# Patient Record
Sex: Male | Born: 1940 | Race: White | Hispanic: No | Marital: Married | Smoking: Former smoker
Health system: Southern US, Community
[De-identification: ages and names within clinical notes are randomized; demographics above are authoritative.]

## PROBLEM LIST (undated history)

## (undated) DIAGNOSIS — N400 Enlarged prostate without lower urinary tract symptoms: Secondary | ICD-10-CM

## (undated) DIAGNOSIS — M069 Rheumatoid arthritis, unspecified: Secondary | ICD-10-CM

## (undated) DIAGNOSIS — N419 Inflammatory disease of prostate, unspecified: Secondary | ICD-10-CM

## (undated) DIAGNOSIS — J309 Allergic rhinitis, unspecified: Secondary | ICD-10-CM

## (undated) DIAGNOSIS — D126 Benign neoplasm of colon, unspecified: Secondary | ICD-10-CM

## (undated) DIAGNOSIS — N529 Male erectile dysfunction, unspecified: Secondary | ICD-10-CM

## (undated) DIAGNOSIS — E785 Hyperlipidemia, unspecified: Secondary | ICD-10-CM

## (undated) HISTORY — DX: Benign neoplasm of colon, unspecified: D12.6

## (undated) HISTORY — DX: Benign prostatic hyperplasia without lower urinary tract symptoms: N40.0

## (undated) HISTORY — DX: Rheumatoid arthritis, unspecified: M06.9

## (undated) HISTORY — DX: Male erectile dysfunction, unspecified: N52.9

## (undated) HISTORY — PX: OTHER SURGICAL HISTORY: SHX169

## (undated) HISTORY — DX: Inflammatory disease of prostate, unspecified: N41.9

## (undated) HISTORY — DX: Hyperlipidemia, unspecified: E78.5

## (undated) HISTORY — DX: Allergic rhinitis, unspecified: J30.9

## (undated) HISTORY — PX: COLONOSCOPY WITH ESOPHAGOGASTRODUODENOSCOPY (EGD): SHX5779

---

## 2009-09-03 ENCOUNTER — Ambulatory Visit: Payer: Self-pay | Admitting: Unknown Physician Specialty

## 2012-06-13 ENCOUNTER — Ambulatory Visit: Payer: Self-pay | Admitting: Rheumatology

## 2012-06-13 IMAGING — CT CT ABD-PELV W/ CM
1 of 4 series · 15 of 32 positions shown, 19 images · IV contrast (isovue)
Comparison: none

REASON FOR EXAM: weight loss  abnormal lymph glands in right pelvis
COMMENTS:

PROCEDURE:     KCT - KCT ABDOMEN/PELVIS W  - [DATE] [DATE]
RESULT:     Comparison:  None
TECHNIQUE: Multiple axial images of the abdomen and pelvis were performed
from the lung bases to the pubic symphysis, with p.o. contrast and with 85
mL of Isovue 370 intravenous contrast.

[Series 2: abd 3mm w 3.0 i40f 3 · axial · 0.73mm/px · z∈[-1060,-649]mm · 15 of 151 slices shown, 19 images]
[im 7/151  soft-tissue]
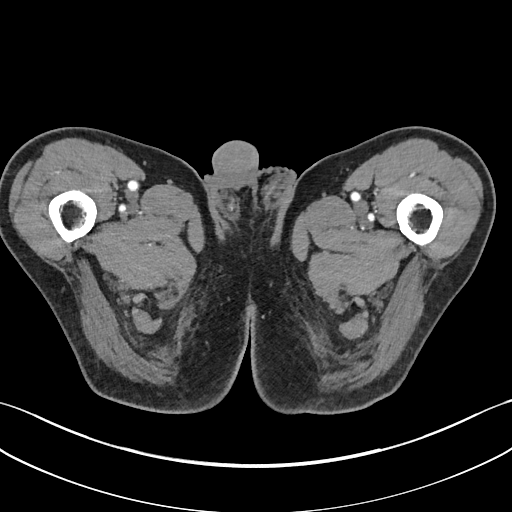
[im 7/151  bone]
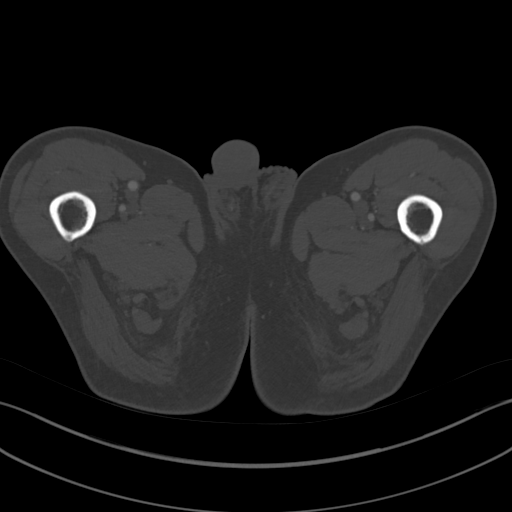
[im 19/151  soft-tissue]
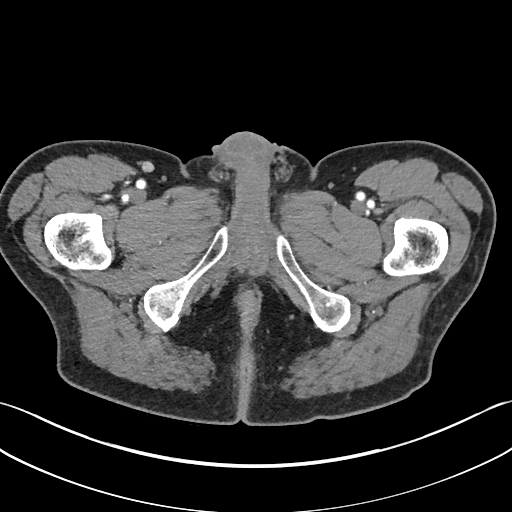
[im 32/151  soft-tissue]
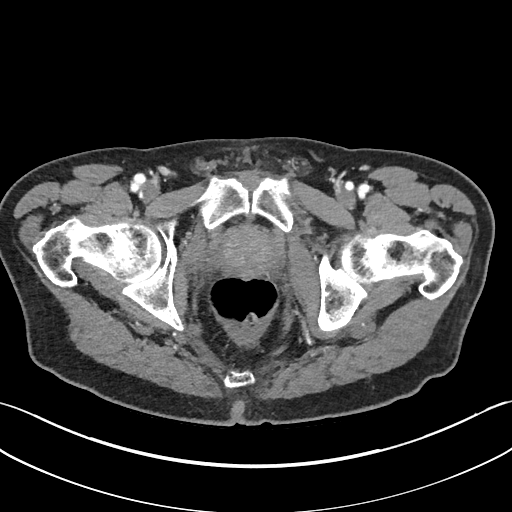
[im 44/151  soft-tissue]
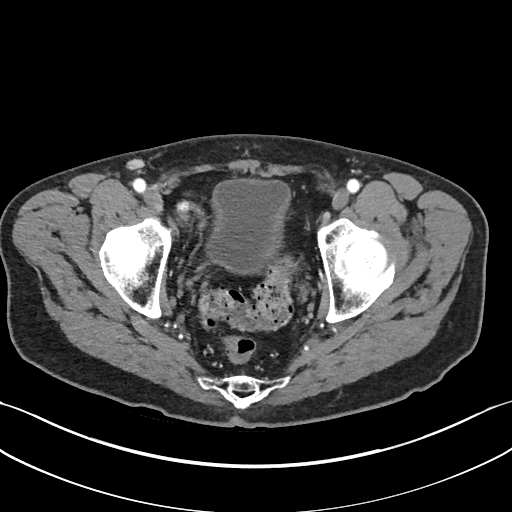
[im 51/151  soft-tissue]
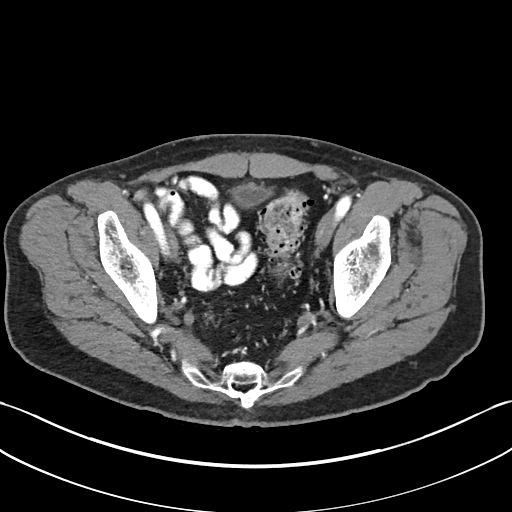
[im 63/151  soft-tissue]
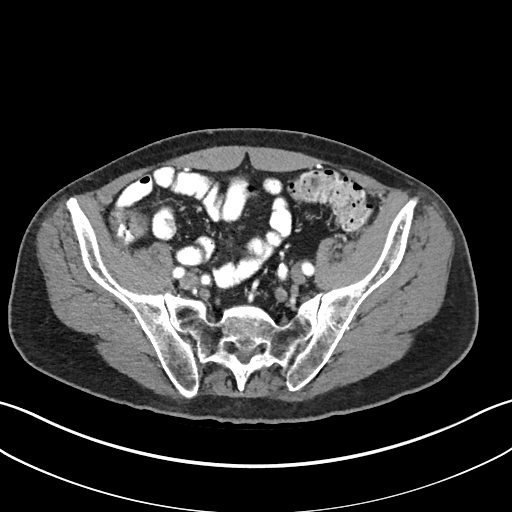
[im 76/151  soft-tissue]
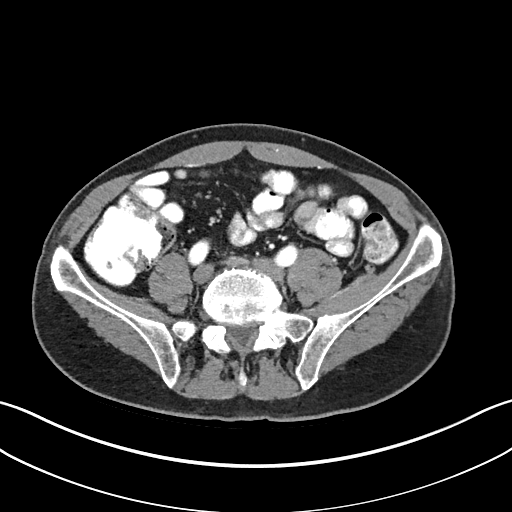
[im 88/151  soft-tissue]
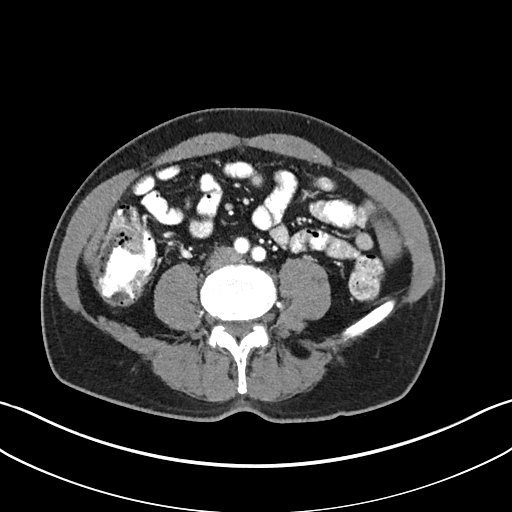
[im 101/151  soft-tissue]
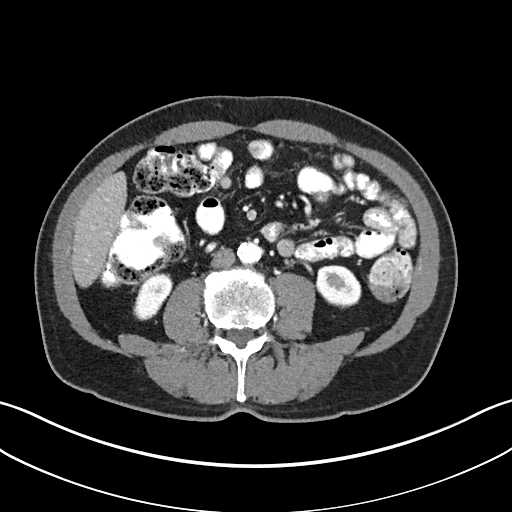
[im 101/151  bone]
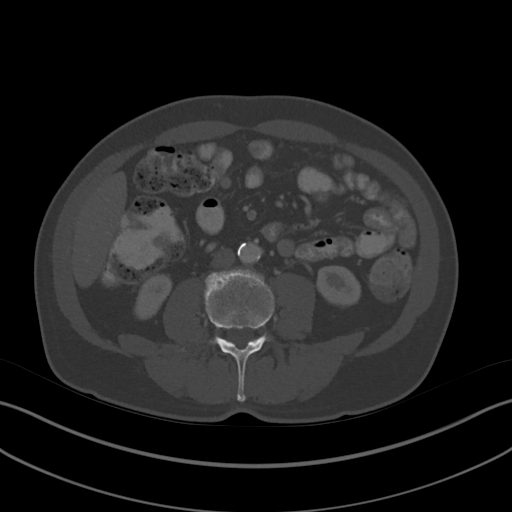
[im 107/151  soft-tissue]
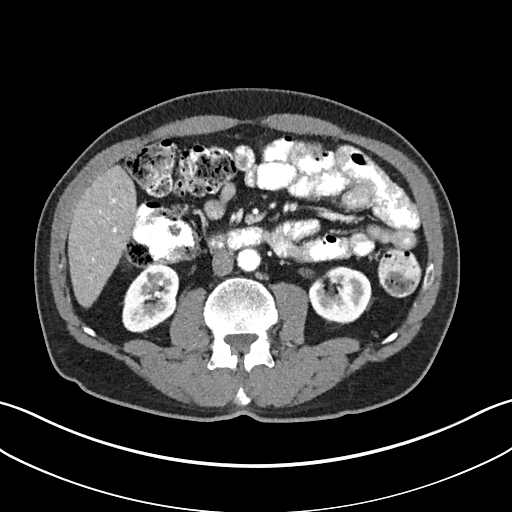
[im 119/151  soft-tissue]
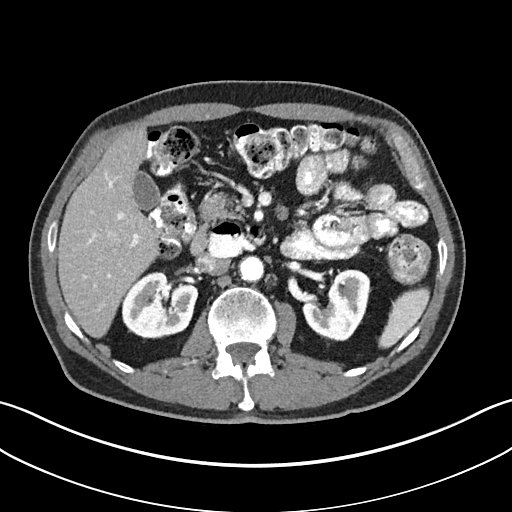
[im 126/151  lung]
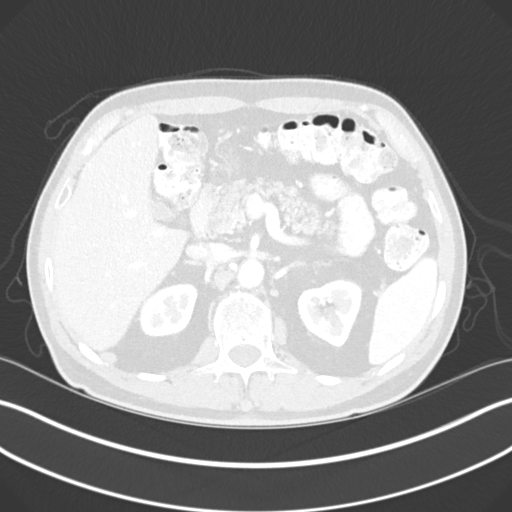
[im 132/151  soft-tissue]
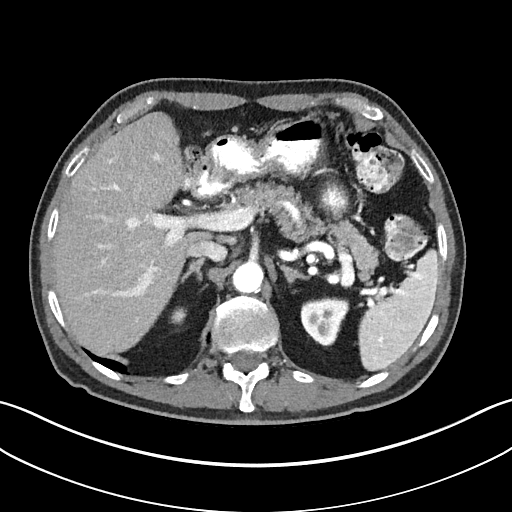
[im 132/151  lung]
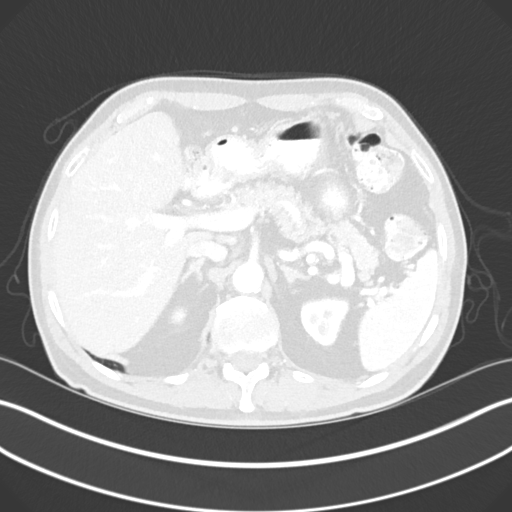
[im 138/151  lung]
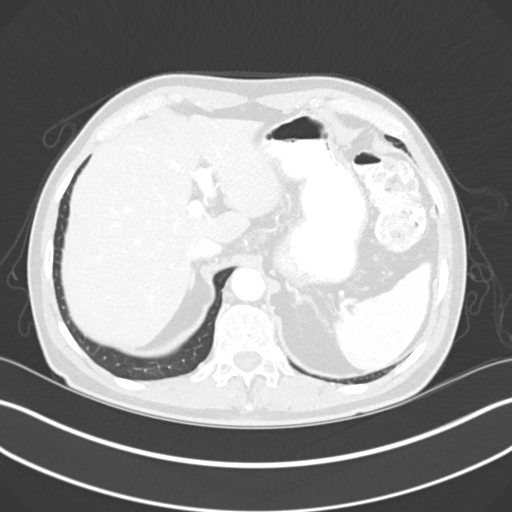
[im 144/151  soft-tissue]
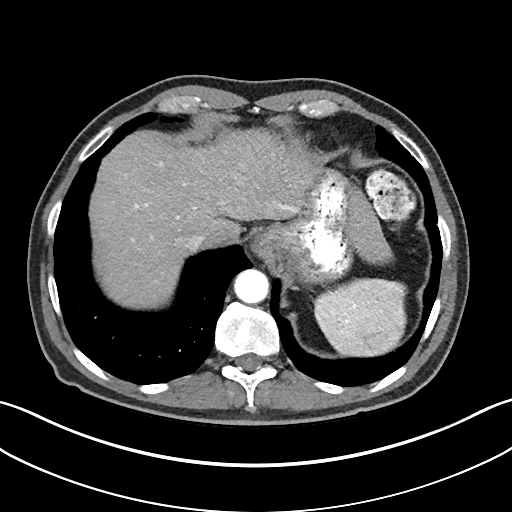
[im 144/151  lung]
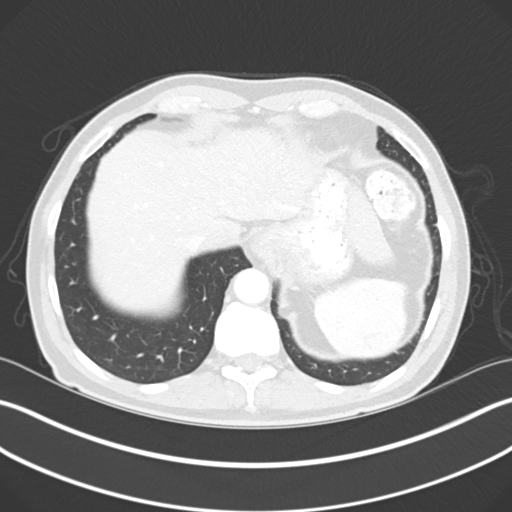

[15 of 32 positions shown; findings below may reference images not displayed]

FINDINGS: Mild wall thickening of the distal esophagus is likely secondary to
underdistention and a small hilar hernia.

The liver is slightly low in attenuation, raising the possibility of hepatic
steatosis. There may be a small stone within the gallbladder. The spleen,
adrenals, and pancreas are unremarkable. There is a duodenal diverticulum
extending from the junction of the second and third segment of the duodenum.
Mild soft tissue thickening along the posterior wall of the gastric fundus
and likely secondary to underdistention. The kidneys enhance normally.

No mesenteric or retroperitoneal lymphadenopathy. There is a focal area of
circumferential wall thickening of the inferior sigmoid colon near the
rectum as seen on image 117. This could be related to underdistention.
However, malignancy cannot excluded. There is diverticulosis of the sigmoid
colon. The small and large bowel are normal in caliber.

No aggressive lytic or sclerotic osseous lesions are identified.
IMPRESSION: 1. Focal apparent wall thickening of the inferior sigmoid colon near the
rectum could be related to underdistention, but the possibility of
malignancy is raised. Colonoscopy is recommended for further evaluation, if
not recently performed.
2. Mild apparent thickening of the distal esophagus is likely related to
underdistention and a small hiatal hernia. Mild thickening along the
posterior wall the fundus but the secondary to underdistention. However,
endoscopy could be performed for further evaluation, as indicated.

## 2012-06-22 ENCOUNTER — Ambulatory Visit: Payer: Self-pay | Admitting: Unknown Physician Specialty

## 2012-06-26 LAB — PATHOLOGY REPORT

## 2013-01-25 ENCOUNTER — Ambulatory Visit: Payer: Self-pay | Admitting: Unknown Physician Specialty

## 2014-07-14 DIAGNOSIS — Z23 Encounter for immunization: Secondary | ICD-10-CM | POA: Diagnosis not present

## 2014-12-23 DIAGNOSIS — Z23 Encounter for immunization: Secondary | ICD-10-CM | POA: Diagnosis not present

## 2015-01-20 DIAGNOSIS — M069 Rheumatoid arthritis, unspecified: Secondary | ICD-10-CM | POA: Diagnosis not present

## 2015-01-20 DIAGNOSIS — Z79899 Other long term (current) drug therapy: Secondary | ICD-10-CM | POA: Diagnosis not present

## 2015-02-12 DIAGNOSIS — M0579 Rheumatoid arthritis with rheumatoid factor of multiple sites without organ or systems involvement: Secondary | ICD-10-CM | POA: Diagnosis not present

## 2015-02-13 DIAGNOSIS — N400 Enlarged prostate without lower urinary tract symptoms: Secondary | ICD-10-CM | POA: Diagnosis not present

## 2015-02-13 DIAGNOSIS — N401 Enlarged prostate with lower urinary tract symptoms: Secondary | ICD-10-CM | POA: Diagnosis not present

## 2015-02-13 DIAGNOSIS — Z87438 Personal history of other diseases of male genital organs: Secondary | ICD-10-CM | POA: Diagnosis not present

## 2015-03-26 ENCOUNTER — Other Ambulatory Visit: Payer: Self-pay | Admitting: Urology

## 2015-03-26 DIAGNOSIS — N411 Chronic prostatitis: Secondary | ICD-10-CM

## 2015-03-28 ENCOUNTER — Other Ambulatory Visit: Payer: Self-pay | Admitting: Urology

## 2015-05-13 ENCOUNTER — Telehealth: Payer: Self-pay

## 2015-05-13 DIAGNOSIS — N390 Urinary tract infection, site not specified: Secondary | ICD-10-CM

## 2015-05-13 NOTE — Telephone Encounter (Signed)
Spoke with pt wife, Pamala Hurry, who stated pt initially stopped taking macrobid in April after last appt. Per wife pt then developed an infection and was on cipro 500mg  bid x4weeks. Starting 04/27/15 pt started taking macrobid bid as prophylaxis. Please advise.

## 2015-05-13 NOTE — Telephone Encounter (Signed)
Pt pharmacy sent a refill request for macrobid. Refill request stated pt reports taking 1 po bid. I dont see anything in epic and in [employer] I see where macrobid was d/c. Please advise.

## 2015-05-13 NOTE — Telephone Encounter (Signed)
I would call Mr. Swingler and ask him if he initiated the refill request.

## 2015-05-13 NOTE — Telephone Encounter (Signed)
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  Macrobid has been known to cause pulmonary issues in long-term use.  I would recommend a UCx and to start tamsulosin 0.4 mg po daily, 30 minutes after a meal.  We will start tamsulosin.    Please advise him of the side effects, such as: retrograde ejaculation, sinus congestion, nasal congestion, rhinorrhea, rhinitis, dizziness, and seasonal allergic rhinitis.  Pending UCx results, we can decide on follow up.

## 2015-05-14 ENCOUNTER — Other Ambulatory Visit: Payer: Self-pay

## 2015-05-14 ENCOUNTER — Other Ambulatory Visit: Payer: Medicare Other

## 2015-05-14 ENCOUNTER — Telehealth: Payer: Self-pay | Admitting: Urology

## 2015-05-14 DIAGNOSIS — M0579 Rheumatoid arthritis with rheumatoid factor of multiple sites without organ or systems involvement: Secondary | ICD-10-CM | POA: Diagnosis not present

## 2015-05-14 DIAGNOSIS — N410 Acute prostatitis: Secondary | ICD-10-CM | POA: Diagnosis not present

## 2015-05-14 MED ORDER — TAMSULOSIN HCL 0.4 MG PO CAPS: 0.4000 mg | ORAL_CAPSULE | Freq: Every day | ORAL | Status: DC

## 2015-05-14 NOTE — Telephone Encounter (Signed)
Pt called and left message requesting refill for Macrobid.  He tried to discontinue but was unsuccessful.  Please call pt [phone]

## 2015-05-14 NOTE — Telephone Encounter (Signed)
Spoke with pt and made aware of Shannon's orders. Pt voiced understanding. Pt will drop of urine for cx this afternoon. Tamsulosin was called into pharmacy.

## 2015-05-14 NOTE — Telephone Encounter (Signed)
LMOM

## 2015-05-16 LAB — URINE CULTURE: Organism ID, Bacteria: NO GROWTH

## 2015-05-18 ENCOUNTER — Telehealth: Payer: Self-pay | Admitting: Urology

## 2015-05-18 DIAGNOSIS — R339 Retention of urine, unspecified: Secondary | ICD-10-CM

## 2015-05-18 NOTE — Telephone Encounter (Signed)
-----   Message from Nori Riis, PA-C sent at 05/16/2015  6:22 PM EDT ----- UCx is negative.

## 2015-05-18 NOTE — Telephone Encounter (Signed)
Spoke w/pts wife and relayed the results of his u/a.  She mentioned a Rx that she thought the provider was going to send to their pharmacy.  I explained that the provider was not going to fill the Rx because it is no longer protocol (per Toniann Fail, LPN) and that Mr. Stuart Mcneil must come in for an appointment to address his ongoing symptoms. . . sm

## 2015-05-18 NOTE — Telephone Encounter (Signed)
Was patient's Flomax called into his pharmacy?  His wife called and stated the prescription was not called in.

## 2015-05-19 MED ORDER — TAMSULOSIN HCL 0.4 MG PO CAPS: 0.4000 mg | ORAL_CAPSULE | Freq: Every day | ORAL | Status: DC

## 2015-05-19 NOTE — Telephone Encounter (Signed)
Spoke with pt wife in reference to medication. Made wife aware medication was called in again. Wife voiced understanding.

## 2015-07-25 DIAGNOSIS — Z23 Encounter for immunization: Secondary | ICD-10-CM | POA: Diagnosis not present

## 2015-08-25 DIAGNOSIS — M0579 Rheumatoid arthritis with rheumatoid factor of multiple sites without organ or systems involvement: Secondary | ICD-10-CM | POA: Diagnosis not present

## 2015-09-30 DIAGNOSIS — H52212 Irregular astigmatism, left eye: Secondary | ICD-10-CM | POA: Diagnosis not present

## 2015-09-30 DIAGNOSIS — H00029 Hordeolum internum unspecified eye, unspecified eyelid: Secondary | ICD-10-CM | POA: Diagnosis not present

## 2015-09-30 DIAGNOSIS — H04123 Dry eye syndrome of bilateral lacrimal glands: Secondary | ICD-10-CM | POA: Diagnosis not present

## 2015-09-30 DIAGNOSIS — H524 Presbyopia: Secondary | ICD-10-CM | POA: Diagnosis not present

## 2015-11-06 DIAGNOSIS — M0579 Rheumatoid arthritis with rheumatoid factor of multiple sites without organ or systems involvement: Secondary | ICD-10-CM | POA: Diagnosis not present

## 2015-11-06 DIAGNOSIS — K2270 Barrett's esophagus without dysplasia: Secondary | ICD-10-CM | POA: Diagnosis not present

## 2015-11-06 DIAGNOSIS — Z86010 Personal history of colonic polyps: Secondary | ICD-10-CM | POA: Diagnosis not present

## 2015-11-06 DIAGNOSIS — N400 Enlarged prostate without lower urinary tract symptoms: Secondary | ICD-10-CM | POA: Diagnosis not present

## 2015-11-12 DIAGNOSIS — M0579 Rheumatoid arthritis with rheumatoid factor of multiple sites without organ or systems involvement: Secondary | ICD-10-CM | POA: Diagnosis not present

## 2016-01-25 ENCOUNTER — Encounter: Payer: Self-pay | Admitting: *Deleted

## 2016-02-04 DIAGNOSIS — M0579 Rheumatoid arthritis with rheumatoid factor of multiple sites without organ or systems involvement: Secondary | ICD-10-CM | POA: Diagnosis not present

## 2016-02-11 DIAGNOSIS — M0579 Rheumatoid arthritis with rheumatoid factor of multiple sites without organ or systems involvement: Secondary | ICD-10-CM | POA: Diagnosis not present

## 2016-02-12 ENCOUNTER — Ambulatory Visit (INDEPENDENT_AMBULATORY_CARE_PROVIDER_SITE_OTHER): Payer: Medicare Other | Admitting: Urology

## 2016-02-12 ENCOUNTER — Encounter: Payer: Self-pay | Admitting: Urology

## 2016-02-12 VITALS — BP 121/69 | HR 75 | Ht 71.75 in | Wt 162.5 lb

## 2016-02-12 DIAGNOSIS — N138 Other obstructive and reflux uropathy: Secondary | ICD-10-CM

## 2016-02-12 DIAGNOSIS — N401 Enlarged prostate with lower urinary tract symptoms: Secondary | ICD-10-CM | POA: Diagnosis not present

## 2016-02-12 DIAGNOSIS — N419 Inflammatory disease of prostate, unspecified: Secondary | ICD-10-CM | POA: Diagnosis not present

## 2016-02-12 MED ORDER — TAMSULOSIN HCL 0.4 MG PO CAPS: 0.4000 mg | ORAL_CAPSULE | Freq: Every day | ORAL | Status: DC

## 2016-02-12 NOTE — Progress Notes (Signed)
02/12/2016 9:56 AM   [place] 1941-07-04 [id number]  Referring provider: Leonel Ramsay, MD [place], [place] [address]  Chief Complaint  Patient presents with  . Prostatitis    1 year follow up    HPI: Patient is a 75 year old Caucasian male with a history of chronic prostatitis and BPH with LUTS who presents today for a 1 year follow-up.   Chronic prostatitis Patient has a hx of prostatitis with symptoms being suprapubic and perineal achiness. His LUTS are also increased during these episodes. He has not had any prostatitis symptoms over the last year. He had been on daily Macrobid for a number of years, but he discontinued that therapy approximately 2 years ago.  He is now on tamsulosin daily.    BPH WITH LUTS His IPSS score today is 7, which is mild lower urinary tract symptomatology. He is mostly satisfied with his quality life due to his urinary symptoms.  He denies any dysuria, hematuria or suprapubic pain.   He currently taking tamsulosin 0.4 mg daily.  He also denies any recent fevers, chills, nausea or vomiting.  He does not have a family history of PCa.      IPSS      02/12/16 0900       International Prostate Symptom Score   How often have you had the sensation of not emptying your bladder? Less than 1 in 5     How often have you had to urinate less than every two hours? Less than 1 in 5 times     How often have you found you stopped and started again several times when you urinated? Less than 1 in 5 times     How often have you found it difficult to postpone urination? Less than 1 in 5 times     How often have you had a weak urinary stream? Less than 1 in 5 times     How often have you had to strain to start urination? Not at All     How many times did you typically get up at night to urinate? 2 Times     Total IPSS Score 7     Quality of Life due to urinary symptoms   If you were to spend the rest of your life with your urinary  condition just the way it is now how would you feel about that? Mostly Satisfied        Score:  1-7 Mild 8-19 Moderate 20-35 Severe     PMH: Past Medical History  Diagnosis Date  . Adenomatous colon polyp   . ED (erectile dysfunction)   . BPH (benign prostatic hyperplasia)   . HLD (hyperlipidemia)   . RA (rheumatoid arthritis) ([place])   . AR (allergic rhinitis)   . Prostatitis     Surgical History: Past Surgical History  Procedure Laterality Date  . None      Home Medications:    Medication List       This list is accurate as of: 02/12/16  9:56 AM.  Always use your most recent med list.               aspirin EC 81 MG tablet  Take by mouth.     ENBREL SURECLICK 50 MG/ML injection  Generic drug:  etanercept     etodolac 400 MG tablet  Commonly known as:  LODINE  Reported on [id number]     folic acid 1 MG tablet  Commonly known as:  FOLVITE     Glucosamine-Chondroitin 500-400 MG Caps  Take by mouth.     methotrexate 2.5 MG tablet  Commonly known as:  RHEUMATREX     nitrofurantoin (macrocrystal-monohydrate) 100 MG capsule  Commonly known as:  MACROBID  Take 1 capsule (100 mg total) by mouth daily.     nitrofurantoin 100 MG capsule  Commonly known as:  MACRODANTIN  Take by mouth. Reported on 02/12/2016     PRILOSEC 20 MG capsule  Generic drug:  omeprazole  Take by mouth.     SAW PALMETTO PO  Take by mouth.     tamsulosin 0.4 MG Caps capsule  Commonly known as:  FLOMAX  Take 1 capsule (0.4 mg total) by mouth daily.     tamsulosin 0.4 MG Caps capsule  Commonly known as:  FLOMAX  Take 1 capsule (0.4 mg total) by mouth daily.        Allergies: No Known Allergies  Family History: Family History  Problem Relation Age of Onset  . Diabetes Mellitus II Father   . Diabetes Mellitus II Sister   . Kidney disease Neg Hx   . Prostate cancer Neg Hx     Social History:  reports that he has quit smoking. He does not have any smokeless tobacco  history on file. He reports that he does not drink alcohol or use illicit drugs.  ROS: UROLOGY Frequent Urination?: No Hard to postpone urination?: No Burning/pain with urination?: No Get up at night to urinate?: No Leakage of urine?: No Urine stream starts and stops?: No Trouble starting stream?: No Do you have to strain to urinate?: No Blood in urine?: No Urinary tract infection?: No Sexually transmitted disease?: No Injury to kidneys or bladder?: No Painful intercourse?: No Weak stream?: No Erection problems?: No Penile pain?: No  Gastrointestinal Nausea?: No Vomiting?: No Indigestion/heartburn?: No Diarrhea?: No Constipation?: No  Constitutional Fever: No Night sweats?: No Weight loss?: No Fatigue?: No  Skin Skin rash/lesions?: No Itching?: No  Eyes Blurred vision?: No Double vision?: No  Ears/Nose/Throat Sore throat?: No Sinus problems?: No  Hematologic/Lymphatic Swollen glands?: No Easy bruising?: No  Cardiovascular Leg swelling?: No Chest pain?: No  Respiratory Cough?: No Shortness of breath?: No  Endocrine Excessive thirst?: No  Musculoskeletal Back pain?: No Joint pain?: No  Neurological Headaches?: No Dizziness?: No  Psychologic Depression?: No Anxiety?: No  Physical Exam: BP 121/69 mmHg  Pulse 75  Ht 5' 11.75" (1.822 m)  Wt 162 lb 8 oz (73.71 kg)  BMI 22.20 kg/m2  Constitutional: Well nourished. Alert and oriented, No acute distress. HEENT: [place] AT, moist mucus membranes. Trachea midline, no masses. Cardiovascular: No clubbing, cyanosis, or edema. Respiratory: Normal respiratory effort, no increased work of breathing. GI: Abdomen is soft, non tender, non distended, no abdominal masses. Liver and spleen not palpable.  No hernias appreciated.  Stool sample for occult testing is not indicated.   GU: No CVA tenderness.  No bladder fullness or masses.  Patient with uncircumcised phallus. Foreskin easily retracted Urethral  meatus is patent.  No penile discharge. No penile lesions or rashes. Scrotum without lesions, cysts, rashes and/or edema.  Testicles are located scrotally bilaterally. No masses are appreciated in the testicles. Left and right epididymis are normal. Rectal: Patient with  normal sphincter tone. Anus and perineum without scarring or rashes. No rectal masses are appreciated. Prostate is approximately 60 grams, no nodules are appreciated. Seminal vesicles are normal. Skin: No rashes, bruises or suspicious lesions.  Lymph: No cervical or inguinal adenopathy. Neurologic: Grossly intact, no focal deficits, moving all 4 extremities. Psychiatric: Normal mood and affect.  Laboratory Data: PSA History:  0.9 ng/mL on 02/11/2013  1.1 ng/mL on 02/12/2014  1.1 ng/mL on 02/13/2015  1.0 ng/mL on 02/12/2016     Assessment & Plan:    1. Prostatitis:   Patient's prostatitis seems to be controlled with tamsulosin 0.4 mg daily. He'll continue this medication. He will follow-up in one year for symptom recheck and exam.  2. BPH with LUTS:   IPSS score is 7/2.  He will continue the tamsulosin 0.4 mg daily.  He will have his IPSS score, exam and PSA in 12 months.   - PSA   Return in about 1 year (around 02/11/2017) for IPSS and exam.  These notes generated with voice recognition software. I apologize for typographical errors.  Zara Council, [facility] [address], [place], [place] [address] [phone]

## 2016-02-13 LAB — PSA: PROSTATE SPECIFIC AG, SERUM: 1 ng/mL (ref 0.0–4.0)

## 2016-02-15 ENCOUNTER — Telehealth: Payer: Self-pay

## 2016-02-15 NOTE — Telephone Encounter (Signed)
Spoke with pt wife in reference to PSA results. Wife voiced understanding.  

## 2016-02-15 NOTE — Telephone Encounter (Signed)
-----   Message from Nori Riis, PA-C sent at 02/14/2016  5:47 PM EDT ----- PSA is stable we will see him in one year.

## 2016-02-18 DIAGNOSIS — N419 Inflammatory disease of prostate, unspecified: Secondary | ICD-10-CM | POA: Insufficient documentation

## 2016-02-18 DIAGNOSIS — N138 Other obstructive and reflux uropathy: Secondary | ICD-10-CM | POA: Insufficient documentation

## 2016-02-18 DIAGNOSIS — N401 Enlarged prostate with lower urinary tract symptoms: Principal | ICD-10-CM | POA: Insufficient documentation

## 2016-05-10 DIAGNOSIS — M0579 Rheumatoid arthritis with rheumatoid factor of multiple sites without organ or systems involvement: Secondary | ICD-10-CM | POA: Diagnosis not present

## 2016-07-19 DIAGNOSIS — Z23 Encounter for immunization: Secondary | ICD-10-CM | POA: Diagnosis not present

## 2016-08-09 DIAGNOSIS — M0579 Rheumatoid arthritis with rheumatoid factor of multiple sites without organ or systems involvement: Secondary | ICD-10-CM | POA: Diagnosis not present

## 2016-11-01 DIAGNOSIS — N400 Enlarged prostate without lower urinary tract symptoms: Secondary | ICD-10-CM | POA: Diagnosis not present

## 2016-11-01 DIAGNOSIS — K2270 Barrett's esophagus without dysplasia: Secondary | ICD-10-CM | POA: Diagnosis not present

## 2016-11-01 DIAGNOSIS — M0579 Rheumatoid arthritis with rheumatoid factor of multiple sites without organ or systems involvement: Secondary | ICD-10-CM | POA: Diagnosis not present

## 2016-11-07 DIAGNOSIS — K2270 Barrett's esophagus without dysplasia: Secondary | ICD-10-CM | POA: Diagnosis not present

## 2016-11-07 DIAGNOSIS — L989 Disorder of the skin and subcutaneous tissue, unspecified: Secondary | ICD-10-CM | POA: Diagnosis not present

## 2016-11-07 DIAGNOSIS — N400 Enlarged prostate without lower urinary tract symptoms: Secondary | ICD-10-CM | POA: Diagnosis not present

## 2016-11-07 DIAGNOSIS — Z8739 Personal history of other diseases of the musculoskeletal system and connective tissue: Secondary | ICD-10-CM | POA: Diagnosis not present

## 2016-11-07 DIAGNOSIS — Z0000 Encounter for general adult medical examination without abnormal findings: Secondary | ICD-10-CM | POA: Diagnosis not present

## 2016-12-13 DIAGNOSIS — L728 Other follicular cysts of the skin and subcutaneous tissue: Secondary | ICD-10-CM | POA: Diagnosis not present

## 2016-12-13 DIAGNOSIS — D2372 Other benign neoplasm of skin of left lower limb, including hip: Secondary | ICD-10-CM | POA: Diagnosis not present

## 2016-12-13 DIAGNOSIS — D485 Neoplasm of uncertain behavior of skin: Secondary | ICD-10-CM | POA: Diagnosis not present

## 2017-01-05 DIAGNOSIS — L720 Epidermal cyst: Secondary | ICD-10-CM | POA: Diagnosis not present

## 2017-01-05 DIAGNOSIS — L728 Other follicular cysts of the skin and subcutaneous tissue: Secondary | ICD-10-CM | POA: Diagnosis not present

## 2017-01-05 DIAGNOSIS — R208 Other disturbances of skin sensation: Secondary | ICD-10-CM | POA: Diagnosis not present

## 2017-01-05 DIAGNOSIS — L538 Other specified erythematous conditions: Secondary | ICD-10-CM | POA: Diagnosis not present

## 2017-01-25 DIAGNOSIS — M0579 Rheumatoid arthritis with rheumatoid factor of multiple sites without organ or systems involvement: Secondary | ICD-10-CM | POA: Diagnosis not present

## 2017-02-06 ENCOUNTER — Other Ambulatory Visit: Payer: Self-pay

## 2017-02-06 ENCOUNTER — Other Ambulatory Visit: Payer: Medicare Other

## 2017-02-06 DIAGNOSIS — N401 Enlarged prostate with lower urinary tract symptoms: Secondary | ICD-10-CM | POA: Diagnosis not present

## 2017-02-07 DIAGNOSIS — Z79899 Other long term (current) drug therapy: Secondary | ICD-10-CM | POA: Diagnosis not present

## 2017-02-07 DIAGNOSIS — M0579 Rheumatoid arthritis with rheumatoid factor of multiple sites without organ or systems involvement: Secondary | ICD-10-CM | POA: Diagnosis not present

## 2017-02-07 LAB — PSA: Prostate Specific Ag, Serum: 1.4 ng/mL (ref 0.0–4.0)

## 2017-02-10 NOTE — Progress Notes (Signed)
02/13/2017 9:06 AM   [place] 10-25-1940 [id number]  Referring provider: Leonel Ramsay, MD [place], [place] [address]  Chief Complaint  Patient presents with  . Benign Prostatic Hypertrophy    1 year follow up  . Prostatitis    HPI: Patient is a 76 year old Caucasian male with a history of chronic prostatitis and BPH with LUTS who presents today for a 1 year follow-up.   History of chronic prostatitis Patient has a hx of prostatitis with symptoms being suprapubic and perineal achiness. His LUTS are also increased during these episodes. He has not had any prostatitis symptoms over the last year. He had been on daily Macrobid for a number of years, but he discontinued that therapy approximately 2 years ago.  He is now on tamsulosin daily.    BPH WITH LUTS His IPSS score today is 10, which is moderate lower urinary tract symptomatology. He is mostly satisfied with his quality life due to his urinary symptoms.  His PVR is 31 mL.  His previous I PSS score was 7/2.  His major complaint today is nocturia x 3.  He denies any dysuria, hematuria or suprapubic pain.   He currently taking tamsulosin 0.4 mg daily.  He also denies any recent fevers, chills, nausea or vomiting.  He does not have a family history of PCa.      IPSS    Row Name 02/13/17 0800         International Prostate Symptom Score   How often have you had the sensation of not emptying your bladder? Less than 1 in 5     How often have you had to urinate less than every two hours? Less than half the time     How often have you found you stopped and started again several times when you urinated? Less than 1 in 5 times     How often have you found it difficult to postpone urination? Less than 1 in 5 times     How often have you had a weak urinary stream? Less than half the time     How often have you had to strain to start urination? Not at All     How many times did you typically get up at  night to urinate? 3 Times     Total IPSS Score 10       Quality of Life due to urinary symptoms   If you were to spend the rest of your life with your urinary condition just the way it is now how would you feel about that? Mostly Satisfied        Score:  1-7 Mild 8-19 Moderate 20-35 Severe     PMH: Past Medical History:  Diagnosis Date  . Adenomatous colon polyp   . AR (allergic rhinitis)   . BPH (benign prostatic hyperplasia)   . ED (erectile dysfunction)   . HLD (hyperlipidemia)   . Prostatitis   . RA (rheumatoid arthritis) ([place])     Surgical History: Past Surgical History:  Procedure Laterality Date  . none      Home Medications:  Allergies as of 02/13/2017   No Known Allergies     Medication List       Accurate as of 02/13/17  9:06 AM. Always use your most recent med list.          aspirin EC 81 MG tablet Take by mouth.   ENBREL SURECLICK 50 MG/ML injection Generic  drug:  etanercept   etodolac 400 MG tablet Commonly known as:  LODINE Reported on 02/12/2016   fluticasone 50 MCG/ACT nasal spray Commonly known as:  FLONASE USE 2 SPRAYS IN EACH NOSTRIL ONCE DAILY   folic acid 1 MG tablet Commonly known as:  FOLVITE   GLUCOSAMINE CHONDROITIN ADV PO Take by mouth.   Glucosamine-Chondroitin 500-400 MG Caps Take by mouth.   methotrexate 2.5 MG tablet Commonly known as:  RHEUMATREX   nitrofurantoin (macrocrystal-monohydrate) 100 MG capsule Commonly known as:  MACROBID Take 1 capsule (100 mg total) by mouth daily.   nitrofurantoin 100 MG capsule Commonly known as:  MACRODANTIN Take by mouth. Reported on 02/12/2016   PRILOSEC 20 MG capsule Generic drug:  omeprazole Take by mouth.   tamsulosin 0.4 MG Caps capsule Commonly known as:  FLOMAX Take 1 capsule (0.4 mg total) by mouth daily.       Allergies: No Known Allergies  Family History: Family History  Problem Relation Age of Onset  . Diabetes Mellitus II Father   . Diabetes  Mellitus II Sister   . Kidney disease Neg Hx   . Prostate cancer Neg Hx   . Bladder Cancer Neg Hx     Social History:  reports that he has quit smoking. He has never used smokeless tobacco. He reports that he does not drink alcohol or use drugs.  ROS: UROLOGY Frequent Urination?: No Hard to postpone urination?: No Burning/pain with urination?: No Get up at night to urinate?: Yes Leakage of urine?: No Urine stream starts and stops?: No Trouble starting stream?: No Do you have to strain to urinate?: No Blood in urine?: No Urinary tract infection?: No Sexually transmitted disease?: No Injury to kidneys or bladder?: No Painful intercourse?: No Weak stream?: No Erection problems?: No Penile pain?: No  Gastrointestinal Nausea?: No Vomiting?: No Indigestion/heartburn?: No Diarrhea?: No Constipation?: No  Constitutional Fever: No Night sweats?: No Weight loss?: No Fatigue?: No  Skin Skin rash/lesions?: No Itching?: No  Eyes Blurred vision?: No Double vision?: No  Ears/Nose/Throat Sore throat?: No Sinus problems?: No  Hematologic/Lymphatic Swollen glands?: No Easy bruising?: No  Cardiovascular Leg swelling?: No Chest pain?: No  Respiratory Cough?: No Shortness of breath?: No  Endocrine Excessive thirst?: No  Musculoskeletal Back pain?: No Joint pain?: No  Neurological Headaches?: No Dizziness?: No  Psychologic Depression?: No Anxiety?: No  Physical Exam: BP (!) 142/70   Pulse 76   Ht 6' (1.829 m)   Wt 165 lb 4.8 oz (75 kg)   BMI 22.42 kg/m   Constitutional: Well nourished. Alert and oriented, No acute distress. HEENT: [place] AT, moist mucus membranes. Trachea midline, no masses. Cardiovascular: No clubbing, cyanosis, or edema. Respiratory: Normal respiratory effort, no increased work of breathing. GI: Abdomen is soft, non tender, non distended, no abdominal masses. Liver and spleen not palpable.  No hernias appreciated.  Stool sample for  occult testing is not indicated.   GU: No CVA tenderness.  No bladder fullness or masses.  Patient with uncircumcised phallus. Foreskin easily retracted Urethral meatus is patent.  No penile discharge. No penile lesions or rashes. Scrotum without lesions, cysts, rashes and/or edema.  Testicles are located scrotally bilaterally. No masses are appreciated in the testicles. Left and right epididymis are normal. Rectal: Patient with  normal sphincter tone. Anus and perineum without scarring or rashes. No rectal masses are appreciated. Prostate is approximately 60 grams, no nodules are appreciated. Seminal vesicles are normal. Skin: No rashes, bruises or suspicious lesions.  Lymph: No cervical or inguinal adenopathy. Neurologic: Grossly intact, no focal deficits, moving all 4 extremities. Psychiatric: Normal mood and affect.  Laboratory Data: PSA History:  0.9 ng/mL on 02/11/2013  1.1 ng/mL on 02/12/2014  1.1 ng/mL on 02/13/2015  1.0 ng/mL on 02/12/2016  1.4 ng/mL on 02/06/2017   Assessment & Plan:    1. History of chronic prostatitis:   Patient's prostatitis seems to be controlled with tamsulosin 0.4 mg daily. He'll continue this medication. He will follow-up in one year for symptom recheck and exam.  2. BPH with LUTS  - IPSS score is 10/2, it is worsening  - Continue conservative management, avoiding bladder irritants and timed voiding's  - most bothersome symptoms is/are nocturia x 3   - Continue tamsulosin 0.4 mg daily:refills given  - RTC in 12 months for IPS'S and exam   3. PSA screening  - I discussed the AUA Guideline's (2013) for men aged 57+ years or any man with less than a 10 to 15 year life expectancy that screening is not recommended.  If the individual is in excellent health and after discussion it is decided to do a screening PSA, the threshold for biopsy should be raised to 10 ng/mL and if the PSA returns below 3 ng/mL, discontinue screening - patient agrees  4. Nocturia  -  I explained to the patient that nocturia is often multi-factorial and difficult to treat.  Sleeping disorders, heart conditions, peripheral vascular disease, diabetes, an enlarged prostate for men, an urethral stricture causing bladder outlet obstruction and/or certain medications can contribute to nocturia.  - I have suggested that the patient avoid caffeine after noon and alcohol in the evening.  He or she may also benefit from fluid restrictions after 6:00 in the evening and voiding just prior to bedtime.  - I have explained that research studies have showed that over 84% of patients with sleep apnea reported frequent nighttime urination.   With sleep apnea, oxygen decreases, carbon dioxide increases, the blood become more acidic, the heart rate drops and blood vessels in the lung constrict.  The body is then alerted that something is very wrong. The sleeper must wake enough to reopen the airway. By this time, the heart is racing and experiences a false signal of fluid overload. The heart excretes a hormone-like protein that tells the body to get rid of sodium and water, resulting in nocturia.  -  I also informed the patient that a recent study noted that decreasing sodium intake to 2.3 grams daily, if they don't have issues with hyponatremia, can also reduce the number of nightly voids  - The patient may benefit from a discussion with his or her primary care physician to see if he or she has risk factors for sleep apnea or other sleep disturbances and obtaining a sleep study.   Return in about 1 year (around 02/13/2018) for IPSS and PVR.  These notes generated with voice recognition software. I apologize for typographical errors.  Zara Council, [facility] [address], [place], [place] [address] [phone]

## 2017-02-13 ENCOUNTER — Ambulatory Visit (INDEPENDENT_AMBULATORY_CARE_PROVIDER_SITE_OTHER): Payer: Medicare Other | Admitting: Urology

## 2017-02-13 ENCOUNTER — Encounter: Payer: Self-pay | Admitting: Urology

## 2017-02-13 VITALS — BP 142/70 | HR 76 | Ht 72.0 in | Wt 165.3 lb

## 2017-02-13 DIAGNOSIS — R351 Nocturia: Secondary | ICD-10-CM | POA: Diagnosis not present

## 2017-02-13 DIAGNOSIS — R339 Retention of urine, unspecified: Secondary | ICD-10-CM

## 2017-02-13 DIAGNOSIS — N419 Inflammatory disease of prostate, unspecified: Secondary | ICD-10-CM

## 2017-02-13 DIAGNOSIS — N401 Enlarged prostate with lower urinary tract symptoms: Secondary | ICD-10-CM | POA: Diagnosis not present

## 2017-02-13 DIAGNOSIS — N138 Other obstructive and reflux uropathy: Secondary | ICD-10-CM

## 2017-02-13 LAB — BLADDER SCAN AMB NON-IMAGING: SCAN RESULT: 31

## 2017-02-13 MED ORDER — TAMSULOSIN HCL 0.4 MG PO CAPS: 0.4000 mg | ORAL_CAPSULE | Freq: Every day | ORAL | 11 refills | Status: DC

## 2017-07-11 DIAGNOSIS — Z23 Encounter for immunization: Secondary | ICD-10-CM | POA: Diagnosis not present

## 2017-08-04 DIAGNOSIS — Z86010 Personal history of colonic polyps: Secondary | ICD-10-CM | POA: Diagnosis not present

## 2017-08-04 DIAGNOSIS — K2270 Barrett's esophagus without dysplasia: Secondary | ICD-10-CM | POA: Diagnosis not present

## 2017-08-09 DIAGNOSIS — Z79899 Other long term (current) drug therapy: Secondary | ICD-10-CM | POA: Diagnosis not present

## 2017-08-09 DIAGNOSIS — M0579 Rheumatoid arthritis with rheumatoid factor of multiple sites without organ or systems involvement: Secondary | ICD-10-CM | POA: Diagnosis not present

## 2017-10-18 DIAGNOSIS — H16143 Punctate keratitis, bilateral: Secondary | ICD-10-CM | POA: Diagnosis not present

## 2017-10-18 DIAGNOSIS — H524 Presbyopia: Secondary | ICD-10-CM | POA: Diagnosis not present

## 2017-10-18 DIAGNOSIS — H04123 Dry eye syndrome of bilateral lacrimal glands: Secondary | ICD-10-CM | POA: Diagnosis not present

## 2017-10-18 DIAGNOSIS — H16223 Keratoconjunctivitis sicca, not specified as Sjogren's, bilateral: Secondary | ICD-10-CM | POA: Diagnosis not present

## 2017-12-05 DIAGNOSIS — E785 Hyperlipidemia, unspecified: Secondary | ICD-10-CM | POA: Diagnosis not present

## 2017-12-05 DIAGNOSIS — K2270 Barrett's esophagus without dysplasia: Secondary | ICD-10-CM | POA: Diagnosis not present

## 2017-12-05 DIAGNOSIS — N400 Enlarged prostate without lower urinary tract symptoms: Secondary | ICD-10-CM | POA: Diagnosis not present

## 2017-12-05 DIAGNOSIS — Z0000 Encounter for general adult medical examination without abnormal findings: Secondary | ICD-10-CM | POA: Diagnosis not present

## 2017-12-05 DIAGNOSIS — Z8739 Personal history of other diseases of the musculoskeletal system and connective tissue: Secondary | ICD-10-CM | POA: Diagnosis not present

## 2017-12-12 DIAGNOSIS — M0579 Rheumatoid arthritis with rheumatoid factor of multiple sites without organ or systems involvement: Secondary | ICD-10-CM | POA: Diagnosis not present

## 2017-12-12 DIAGNOSIS — N400 Enlarged prostate without lower urinary tract symptoms: Secondary | ICD-10-CM | POA: Diagnosis not present

## 2017-12-12 DIAGNOSIS — Z0000 Encounter for general adult medical examination without abnormal findings: Secondary | ICD-10-CM | POA: Diagnosis not present

## 2017-12-12 DIAGNOSIS — E785 Hyperlipidemia, unspecified: Secondary | ICD-10-CM | POA: Diagnosis not present

## 2017-12-12 DIAGNOSIS — K2270 Barrett's esophagus without dysplasia: Secondary | ICD-10-CM | POA: Diagnosis not present

## 2017-12-19 DIAGNOSIS — K640 First degree hemorrhoids: Secondary | ICD-10-CM | POA: Diagnosis not present

## 2017-12-19 DIAGNOSIS — K317 Polyp of stomach and duodenum: Secondary | ICD-10-CM | POA: Diagnosis not present

## 2017-12-19 DIAGNOSIS — D131 Benign neoplasm of stomach: Secondary | ICD-10-CM | POA: Diagnosis not present

## 2017-12-19 DIAGNOSIS — Z86010 Personal history of colonic polyps: Secondary | ICD-10-CM | POA: Diagnosis not present

## 2017-12-19 DIAGNOSIS — K5730 Diverticulosis of large intestine without perforation or abscess without bleeding: Secondary | ICD-10-CM | POA: Diagnosis not present

## 2017-12-19 DIAGNOSIS — D126 Benign neoplasm of colon, unspecified: Secondary | ICD-10-CM | POA: Diagnosis not present

## 2017-12-19 DIAGNOSIS — K2270 Barrett's esophagus without dysplasia: Secondary | ICD-10-CM | POA: Diagnosis not present

## 2017-12-19 DIAGNOSIS — K2950 Unspecified chronic gastritis without bleeding: Secondary | ICD-10-CM | POA: Diagnosis not present

## 2017-12-19 DIAGNOSIS — K2990 Gastroduodenitis, unspecified, without bleeding: Secondary | ICD-10-CM | POA: Diagnosis not present

## 2017-12-19 DIAGNOSIS — D125 Benign neoplasm of sigmoid colon: Secondary | ICD-10-CM | POA: Diagnosis not present

## 2017-12-19 DIAGNOSIS — K210 Gastro-esophageal reflux disease with esophagitis: Secondary | ICD-10-CM | POA: Diagnosis not present

## 2017-12-19 DIAGNOSIS — K648 Other hemorrhoids: Secondary | ICD-10-CM | POA: Diagnosis not present

## 2018-01-31 DIAGNOSIS — Z79899 Other long term (current) drug therapy: Secondary | ICD-10-CM | POA: Diagnosis not present

## 2018-01-31 DIAGNOSIS — M0579 Rheumatoid arthritis with rheumatoid factor of multiple sites without organ or systems involvement: Secondary | ICD-10-CM | POA: Diagnosis not present

## 2018-02-08 DIAGNOSIS — Z79899 Other long term (current) drug therapy: Secondary | ICD-10-CM | POA: Diagnosis not present

## 2018-02-08 DIAGNOSIS — M0579 Rheumatoid arthritis with rheumatoid factor of multiple sites without organ or systems involvement: Secondary | ICD-10-CM | POA: Diagnosis not present

## 2018-02-12 ENCOUNTER — Ambulatory Visit: Payer: Medicare Other | Admitting: Urology

## 2018-02-24 DIAGNOSIS — J302 Other seasonal allergic rhinitis: Secondary | ICD-10-CM | POA: Diagnosis not present

## 2018-02-24 DIAGNOSIS — H1033 Unspecified acute conjunctivitis, bilateral: Secondary | ICD-10-CM | POA: Diagnosis not present

## 2018-03-13 NOTE — Progress Notes (Signed)
03/14/2018 11:54 AM   [place] 03-06-1941 [id number]  Referring provider: Leonel Ramsay, MD [place], [place] [address]  Chief Complaint  Patient presents with  . Prostatitis    HPI: Patient is a 77 year old Caucasian male with a history of chronic prostatitis and BPH with LUTS who presents today for a 1 year follow-up.   History of chronic prostatitis Patient has a hx of prostatitis with symptoms being suprapubic and perineal achiness. His LUTS are also increased during these episodes. He has not had any prostatitis symptoms over the last year. He had been on daily Macrobid for a number of years, but he discontinued that therapy approximately 3 years ago.  He is now on tamsulosin daily.    BPH WITH LUTS His IPSS score today is 8, which is moderate lower urinary tract symptomatology. He is mostly satisfied with his quality life due to his urinary symptoms.  His previous I PSS score was 10/2.  His previous PVR was 31 mL.  His major complaint today is frequency.  He denies any dysuria, hematuria or suprapubic pain.   He currently taking tamsulosin 0.4 mg daily.  He also denies any recent fevers, chills, nausea or vomiting.  He does not have a family history of PCa.  He would like to try a different medication other than tamsulosin due to retrograde ejaculation.    IPSS    Row Name 03/14/18 1100         International Prostate Symptom Score   How often have you had the sensation of not emptying your bladder?  Less than 1 in 5     How often have you had to urinate less than every two hours?  Less than 1 in 5 times     How often have you found you stopped and started again several times when you urinated?  Less than 1 in 5 times     How often have you found it difficult to postpone urination?  Less than 1 in 5 times     How often have you had a weak urinary stream?  Less than 1 in 5 times     How often have you had to strain to start urination?  Not  at All     How many times did you typically get up at night to urinate?  3 Times     Total IPSS Score  8       Quality of Life due to urinary symptoms   If you were to spend the rest of your life with your urinary condition just the way it is now how would you feel about that?  Mostly Satisfied        Score:  1-7 Mild 8-19 Moderate 20-35 Severe     PMH: Past Medical History:  Diagnosis Date  . Adenomatous colon polyp   . AR (allergic rhinitis)   . BPH (benign prostatic hyperplasia)   . ED (erectile dysfunction)   . HLD (hyperlipidemia)   . Prostatitis   . RA (rheumatoid arthritis) ([place])     Surgical History: Past Surgical History:  Procedure Laterality Date  . none      Home Medications:  Allergies as of 03/14/2018   No Known Allergies     Medication List        Accurate as of 03/14/18 11:54 AM. Always use your most recent med list.          aspirin EC 81 MG  tablet Take by mouth.   ENBREL SURECLICK 50 MG/ML injection Generic drug:  etanercept   etodolac 400 MG tablet Commonly known as:  LODINE Reported on 02/12/2016   fluticasone 50 MCG/ACT nasal spray Commonly known as:  FLONASE USE 2 SPRAYS IN EACH NOSTRIL ONCE DAILY   folic acid 1 MG tablet Commonly known as:  FOLVITE   GLUCOSAMINE CHONDROITIN ADV PO Take by mouth.   Glucosamine-Chondroitin 500-400 MG Caps Take by mouth.   methotrexate 2.5 MG tablet Commonly known as:  RHEUMATREX   MULTIVITAMIN ADULT PO Take by mouth daily.   nitrofurantoin 100 MG capsule Commonly known as:  MACRODANTIN Take by mouth. Reported on 02/12/2016   PRILOSEC 20 MG capsule Generic drug:  omeprazole Take by mouth.   tadalafil 5 MG tablet Commonly known as:  CIALIS Take 1 tablet (5 mg total) by mouth daily as needed for erectile dysfunction.   tamsulosin 0.4 MG Caps capsule Commonly known as:  FLOMAX Take 1 capsule (0.4 mg total) by mouth daily.       Allergies: No Known Allergies  Family  History: Family History  Problem Relation Age of Onset  . Diabetes Mellitus II Father   . Diabetes Mellitus II Sister   . Kidney disease Neg Hx   . Prostate cancer Neg Hx   . Bladder Cancer Neg Hx     Social History:  reports that he has quit smoking. He has never used smokeless tobacco. He reports that he does not drink alcohol or use drugs.  ROS: UROLOGY Frequent Urination?: Yes Hard to postpone urination?: No Burning/pain with urination?: No Get up at night to urinate?: No Leakage of urine?: No Urine stream starts and stops?: No Trouble starting stream?: No Do you have to strain to urinate?: No Blood in urine?: No Urinary tract infection?: No Sexually transmitted disease?: No Injury to kidneys or bladder?: No Painful intercourse?: No Weak stream?: No Erection problems?: Yes Penile pain?: No  Gastrointestinal Nausea?: No Vomiting?: No Indigestion/heartburn?: No Diarrhea?: No Constipation?: No  Constitutional Fever: No Night sweats?: No Weight loss?: No Fatigue?: No  Skin Skin rash/lesions?: No Itching?: No  Eyes Blurred vision?: No Double vision?: No  Ears/Nose/Throat Sore throat?: No Sinus problems?: Yes  Hematologic/Lymphatic Swollen glands?: No Easy bruising?: No  Cardiovascular Leg swelling?: No Chest pain?: No  Respiratory Cough?: No Shortness of breath?: No  Endocrine Excessive thirst?: No  Musculoskeletal Back pain?: No Joint pain?: No  Neurological Headaches?: No Dizziness?: No  Psychologic Depression?: No Anxiety?: No  Physical Exam: BP (!) 144/74   Pulse 68   Ht 6' (1.829 m)   Wt 154 lb 1.6 oz (69.9 kg)   BMI 20.90 kg/m   Constitutional: Well nourished. Alert and oriented, No acute distress. HEENT:  AT, moist mucus membranes. Trachea midline, no masses. Cardiovascular: No clubbing, cyanosis, or edema. Respiratory: Normal respiratory effort, no increased work of breathing. GI: Abdomen is soft, non tender, non  distended, no abdominal masses. Liver and spleen not palpable.  No hernias appreciated.  Stool sample for occult testing is not indicated.   GU: No CVA tenderness.  No bladder fullness or masses.  Patient with uncircumcised phallus.  Foreskin easily retracted   Urethral meatus is patent.  No penile discharge. No penile lesions or rashes. Scrotum without lesions, cysts, rashes and/or edema.  Testicles are located scrotally bilaterally. No masses are appreciated in the testicles. Left and right epididymis are normal. Rectal: Patient with  normal sphincter tone. Anus and perineum without  scarring or rashes. No rectal masses are appreciated. Prostate is approximately 60 grams, no nodules are appreciated. Seminal vesicles are normal. Skin: No rashes, bruises or suspicious lesions. Lymph: No cervical or inguinal adenopathy. Neurologic: Grossly intact, no focal deficits, moving all 4 extremities. Psychiatric: Normal mood and affect.   Laboratory Data: PSA History:  0.9 ng/mL on 02/11/2013  1.1 ng/mL on 02/12/2014  1.1 ng/mL on 02/13/2015  1.0 ng/mL on 02/12/2016  1.4 ng/mL on 02/06/2017 I have reviewed the labs.  Assessment & Plan:    1. History of chronic prostatitis:   Patient's prostatitis seems to be controlled with tamsulosin 0.4 mg daily. He'll continue this medication. He will follow-up in one year for symptom recheck and exam.  2. BPH with LUTS  - IPSS score is 8/2, it is improving  - Continue conservative management, avoiding bladder irritants and timed voiding's  - most bothersome symptoms is/are frequency which is at goal  - would like to try Cialis 5 mg daily instead of the tamsulosin, script is sent to pharmacy and will call back if it ineffective or cost prohibitive - we than switch to uroxatrol   - RTC in 12 months for IPS'S and exam   3. Nocturia  - not bothersome at this time  Return in about 1 year (around 03/15/2019) for IPSS, SHIM and exam.  These notes generated with  voice recognition software. I apologize for typographical errors.  Zara Council, PA-C  [facility] [address] [place], [place] [address] [phone]

## 2018-03-14 ENCOUNTER — Other Ambulatory Visit: Payer: Self-pay | Admitting: Urology

## 2018-03-14 ENCOUNTER — Encounter: Payer: Self-pay | Admitting: Urology

## 2018-03-14 ENCOUNTER — Ambulatory Visit (INDEPENDENT_AMBULATORY_CARE_PROVIDER_SITE_OTHER): Payer: Medicare Other | Admitting: Urology

## 2018-03-14 VITALS — BP 144/74 | HR 68 | Ht 72.0 in | Wt 154.1 lb

## 2018-03-14 DIAGNOSIS — N419 Inflammatory disease of prostate, unspecified: Secondary | ICD-10-CM | POA: Diagnosis not present

## 2018-03-14 DIAGNOSIS — R351 Nocturia: Secondary | ICD-10-CM | POA: Diagnosis not present

## 2018-03-14 DIAGNOSIS — N138 Other obstructive and reflux uropathy: Secondary | ICD-10-CM

## 2018-03-14 DIAGNOSIS — N401 Enlarged prostate with lower urinary tract symptoms: Secondary | ICD-10-CM

## 2018-03-14 DIAGNOSIS — R339 Retention of urine, unspecified: Secondary | ICD-10-CM

## 2018-03-14 MED ORDER — TADALAFIL 5 MG PO TABS: 5.0000 mg | ORAL_TABLET | Freq: Every day | ORAL | 3 refills | Status: DC | PRN

## 2018-03-16 ENCOUNTER — Other Ambulatory Visit: Payer: Self-pay | Admitting: Urology

## 2018-03-16 DIAGNOSIS — R339 Retention of urine, unspecified: Secondary | ICD-10-CM

## 2018-03-27 ENCOUNTER — Telehealth: Payer: Self-pay | Admitting: Urology

## 2018-03-27 NOTE — Telephone Encounter (Signed)
Pt called office asking to speak to [facility], states he has a few questions following up on his last office appt. About medication. Please advise. [phone].

## 2018-03-28 NOTE — Telephone Encounter (Signed)
Patient had a question regarding how long to wait for Cialis 5mg  daily  to work after stopping the Tamsulosin. Patient was told that these medications do work differently and the Cialis may need 2-4wks to see full effect. If by then he is not pleased with results per Shannon's note patient may restart Tamsulosin or try Uroxatrol

## 2018-05-04 DIAGNOSIS — H16143 Punctate keratitis, bilateral: Secondary | ICD-10-CM | POA: Diagnosis not present

## 2018-05-04 DIAGNOSIS — H10013 Acute follicular conjunctivitis, bilateral: Secondary | ICD-10-CM | POA: Diagnosis not present

## 2018-05-18 DIAGNOSIS — H16223 Keratoconjunctivitis sicca, not specified as Sjogren's, bilateral: Secondary | ICD-10-CM | POA: Diagnosis not present

## 2018-05-18 DIAGNOSIS — H16143 Punctate keratitis, bilateral: Secondary | ICD-10-CM | POA: Diagnosis not present

## 2018-05-21 ENCOUNTER — Ambulatory Visit (INDEPENDENT_AMBULATORY_CARE_PROVIDER_SITE_OTHER): Payer: Medicare Other | Admitting: Podiatry

## 2018-05-21 ENCOUNTER — Ambulatory Visit (INDEPENDENT_AMBULATORY_CARE_PROVIDER_SITE_OTHER): Payer: Medicare Other

## 2018-05-21 ENCOUNTER — Encounter: Payer: Self-pay | Admitting: Podiatry

## 2018-05-21 VITALS — BP 136/75 | HR 70 | Resp 16

## 2018-05-21 DIAGNOSIS — M722 Plantar fascial fibromatosis: Secondary | ICD-10-CM

## 2018-05-21 DIAGNOSIS — Z8739 Personal history of other diseases of the musculoskeletal system and connective tissue: Secondary | ICD-10-CM | POA: Insufficient documentation

## 2018-05-21 DIAGNOSIS — K2270 Barrett's esophagus without dysplasia: Secondary | ICD-10-CM | POA: Insufficient documentation

## 2018-05-21 DIAGNOSIS — Z86010 Personal history of colonic polyps: Secondary | ICD-10-CM | POA: Insufficient documentation

## 2018-05-21 NOTE — Progress Notes (Signed)
Subjective:  Patient ID: Stuart Mcneil, male    DOB: 03/22/1941,  MRN: [id number] HPI Chief Complaint  Patient presents with  . Foot Pain    Plantar heel right - aching x 5-6 weeks, AM pain, tried rolling massage, feeling some better than it was  . New Patient (Initial Visit)    77 y.o. male presents with the above complaint.   ROS: Denies fever chills nausea vomiting muscle aches pains calf pain back pain chest pain shortness of breath.  Past Medical History:  Diagnosis Date  . Adenomatous colon polyp   . AR (allergic rhinitis)   . BPH (benign prostatic hyperplasia)   . ED (erectile dysfunction)   . HLD (hyperlipidemia)   . Prostatitis   . RA (rheumatoid arthritis) ([place])    Past Surgical History:  Procedure Laterality Date  . none      Current Outpatient Medications:  .  aspirin EC 81 MG tablet, Take by mouth., Disp: , Rfl:  .  etanercept (ENBREL SURECLICK) 50 MG/ML injection, , Disp: , Rfl:  .  etodolac (LODINE) 400 MG tablet, Reported on 02/12/2016, Disp: , Rfl:  .  folic acid (FOLVITE) 1 MG tablet, , Disp: , Rfl:  .  Glucosamine-Chondroitin 500-400 MG CAPS, Take by mouth., Disp: , Rfl:  .  methotrexate (RHEUMATREX) 2.5 MG tablet, , Disp: , Rfl:  .  Multiple Vitamins-Minerals (MULTIVITAMIN ADULT PO), Take by mouth daily., Disp: , Rfl:  .  omeprazole (PRILOSEC) 20 MG capsule, Take by mouth., Disp: , Rfl:  .  tadalafil (CIALIS) 5 MG tablet, Take 1 tablet (5 mg total) by mouth daily as needed for erectile dysfunction., Disp: 90 tablet, Rfl: 3 .  tamsulosin (FLOMAX) 0.4 MG CAPS capsule, TAKE 1 CAPSULE BY MOUTH ONCE DAILY, Disp: 30 capsule, Rfl: 11  No Known Allergies Review of Systems Objective:   Vitals:   05/21/18 1130  BP: 136/75  Pulse: 70  Resp: 16    General: Well developed, nourished, in no acute distress, alert and oriented x3   Dermatological: Skin is warm, dry and supple bilateral. Nails x 10 are well maintained; remaining integument appears  unremarkable at this time. There are no open sores, no preulcerative lesions, no rash or signs of infection present.  Vascular: Dorsalis Pedis artery and Posterior Tibial artery pedal pulses are 2/4 bilateral with immedate capillary fill time. Pedal hair growth present. No varicosities and no lower extremity edema present bilateral.   Neruologic: Grossly intact via light touch bilateral. Vibratory intact via tuning fork bilateral. Protective threshold with Semmes Wienstein monofilament intact to all pedal sites bilateral. Patellar and Achilles deep tendon reflexes 2+ bilateral. No Babinski or clonus noted bilateral.   Musculoskeletal: No gross boney pedal deformities bilateral. No pain, crepitus, or limitation noted with foot and ankle range of motion bilateral. Muscular strength 5/5 in all groups tested bilateral.  He has pain on palpation medial calcaneal tubercle and central plantar calcaneal tubercle of the right heel.  Gait: Unassisted, Nonantalgic.    Radiographs:  Radiographs taken today do not demonstrate any type of acute trauma no signs of rheumatoid though he does demonstrate soft tissue increase in density of plantar fascial calcaneal site.  Assessment & Plan:   Assessment: Plantar fasciitis right heel.  Plan: After 20 mg of Kenalog 5 mg Marcaine I injected the right heel after sterile Betadine skin prep.  Tolerated procedure well without complications.  Placed in a plantar fascial brace and a night splint.  Discussed appropriate shoe gear  stretching exercise and ice therapy.     Djuan Talton T. [place], [place]

## 2018-05-21 NOTE — Patient Instructions (Addendum)
For instructions on how to put on your Plantar Fascial Brace, please visit [email or web address] For instructions on how to put on your Night Splint, please visit [email or web address]   Plantar Fasciitis (Heel Spur Syndrome) with Rehab The plantar fascia is a fibrous, ligament-like, soft-tissue structure that spans the bottom of the foot. Plantar fasciitis is a condition that causes pain in the foot due to inflammation of the tissue. SYMPTOMS   Pain and tenderness on the underneath side of the foot.  Pain that worsens with standing or walking. CAUSES  Plantar fasciitis is caused by irritation and injury to the plantar fascia on the underneath side of the foot. Common mechanisms of injury include:  Direct trauma to bottom of the foot.  Damage to a small nerve that runs under the foot where the main fascia attaches to the heel bone.  Stress placed on the plantar fascia due to bone spurs. RISK INCREASES WITH:   Activities that place stress on the plantar fascia (running, jumping, pivoting, or cutting).  Poor strength and flexibility.  Improperly fitted shoes.  Tight calf muscles.  Flat feet.  Failure to warm-up properly before activity.  Obesity. PREVENTION  Warm up and stretch properly before activity.  Allow for adequate recovery between workouts.  Maintain physical fitness:  Strength, flexibility, and endurance.  Cardiovascular fitness.  Maintain a health body weight.  Avoid stress on the plantar fascia.  Wear properly fitted shoes, including arch supports for individuals who have flat feet.  PROGNOSIS  If treated properly, then the symptoms of plantar fasciitis usually resolve without surgery. However, occasionally surgery is necessary.  RELATED COMPLICATIONS   Recurrent symptoms that may result in a chronic condition.  Problems of the lower back that are caused by compensating for the injury, such as limping.  Pain or weakness of the foot during  push-off following surgery.  Chronic inflammation, scarring, and partial or complete fascia tear, occurring more often from repeated injections.  TREATMENT  Treatment initially involves the use of ice and medication to help reduce pain and inflammation. The use of strengthening and stretching exercises may help reduce pain with activity, especially stretches of the Achilles tendon. These exercises may be performed at home or with a therapist. Your caregiver may recommend that you use heel cups of arch supports to help reduce stress on the plantar fascia. Occasionally, corticosteroid injections are given to reduce inflammation. If symptoms persist for greater than 6 months despite non-surgical (conservative), then surgery may be recommended.   MEDICATION   If pain medication is necessary, then nonsteroidal anti-inflammatory medications, such as aspirin and ibuprofen, or other minor pain relievers, such as acetaminophen, are often recommended.  Do not take pain medication within 7 days before surgery.  Prescription pain relievers may be given if deemed necessary by your caregiver. Use only as directed and only as much as you need.  Corticosteroid injections may be given by your caregiver. These injections should be reserved for the most serious cases, because they may only be given a certain number of times.  HEAT AND COLD  Cold treatment (icing) relieves pain and reduces inflammation. Cold treatment should be applied for 10 to 15 minutes every 2 to 3 hours for inflammation and pain and immediately after any activity that aggravates your symptoms. Use ice packs or massage the area with a piece of ice (ice massage).  Heat treatment may be used prior to performing the stretching and strengthening activities prescribed by your caregiver, physical therapist, or athletic trainer. Use a  heat pack or soak the injury in warm water.  SEEK IMMEDIATE MEDICAL CARE IF:  Treatment seems to offer no benefit,  or the condition worsens.  Any medications produce adverse side effects.  EXERCISES- RANGE OF MOTION (ROM) AND STRETCHING EXERCISES - Plantar Fasciitis (Heel Spur Syndrome) These exercises may help you when beginning to rehabilitate your injury. Your symptoms may resolve with or without further involvement from your physician, physical therapist or athletic trainer. While completing these exercises, remember:   Restoring tissue flexibility helps normal motion to return to the joints. This allows healthier, less painful movement and activity.  An effective stretch should be held for at least 30 seconds.  A stretch should never be painful. You should only feel a gentle lengthening or release in the stretched tissue.  RANGE OF MOTION - Toe Extension, Flexion  Sit with your right / left leg crossed over your opposite knee.  Grasp your toes and gently pull them back toward the top of your foot. You should feel a stretch on the bottom of your toes and/or foot.  Hold this stretch for 10 seconds.  Now, gently pull your toes toward the bottom of your foot. You should feel a stretch on the top of your toes and or foot.  Hold this stretch for 10 seconds. Repeat  times. Complete this stretch 3 times per day.   RANGE OF MOTION - Ankle Dorsiflexion, Active Assisted  Remove shoes and sit on a chair that is preferably not on a carpeted surface.  Place right / left foot under knee. Extend your opposite leg for support.  Keeping your heel down, slide your right / left foot back toward the chair until you feel a stretch at your ankle or calf. If you do not feel a stretch, slide your bottom forward to the edge of the chair, while still keeping your heel down.  Hold this stretch for 10 seconds. Repeat 3 times. Complete this stretch 2 times per day.   STRETCH  Gastroc, Standing  Place hands on wall.  Extend right / left leg, keeping the front knee somewhat bent.  Slightly point your toes inward  on your back foot.  Keeping your right / left heel on the floor and your knee straight, shift your weight toward the wall, not allowing your back to arch.  You should feel a gentle stretch in the right / left calf. Hold this position for 10 seconds. Repeat 3 times. Complete this stretch 2 times per day.  STRETCH  Soleus, Standing  Place hands on wall.  Extend right / left leg, keeping the other knee somewhat bent.  Slightly point your toes inward on your back foot.  Keep your right / left heel on the floor, bend your back knee, and slightly shift your weight over the back leg so that you feel a gentle stretch deep in your back calf.  Hold this position for 10 seconds. Repeat 3 times. Complete this stretch 2 times per day.  STRETCH  Gastrocsoleus, Standing  Note: This exercise can place a lot of stress on your foot and ankle. Please complete this exercise only if specifically instructed by your caregiver.   Place the ball of your right / left foot on a step, keeping your other foot firmly on the same step.  Hold on to the wall or a rail for balance.  Slowly lift your other foot, allowing your body weight to press your heel down over the edge of the step.  You  should feel a stretch in your right / left calf.  Hold this position for 10 seconds.  Repeat this exercise with a slight bend in your right / left knee. Repeat 3 times. Complete this stretch 2 times per day.   STRENGTHENING EXERCISES - Plantar Fasciitis (Heel Spur Syndrome)  These exercises may help you when beginning to rehabilitate your injury. They may resolve your symptoms with or without further involvement from your physician, physical therapist or athletic trainer. While completing these exercises, remember:   Muscles can gain both the endurance and the strength needed for everyday activities through controlled exercises.  Complete these exercises as instructed by your physician, physical therapist or athletic  trainer. Progress the resistance and repetitions only as guided.  STRENGTH - Towel Curls  Sit in a chair positioned on a non-carpeted surface.  Place your foot on a towel, keeping your heel on the floor.  Pull the towel toward your heel by only curling your toes. Keep your heel on the floor. Repeat 3 times. Complete this exercise 2 times per day.  STRENGTH - Ankle Inversion  Secure one end of a rubber exercise band/tubing to a fixed object (table, pole). Loop the other end around your foot just before your toes.  Place your fists between your knees. This will focus your strengthening at your ankle.  Slowly, pull your big toe up and in, making sure the band/tubing is positioned to resist the entire motion.  Hold this position for 10 seconds.  Have your muscles resist the band/tubing as it slowly pulls your foot back to the starting position. Repeat 3 times. Complete this exercises 2 times per day.  Document Released: 10/03/2005 Document Revised: 12/26/2011 Document Reviewed: 01/15/2009 [facility] Patient Information 2014 [place], [place].  Plantar Fasciitis Rehab Ask your health care provider which exercises are safe for you. Do exercises exactly as told by your health care provider and adjust them as directed. It is normal to feel mild stretching, pulling, tightness, or discomfort as you do these exercises, but you should stop right away if you feel sudden pain or your pain gets worse. Do not begin these exercises until told by your health care provider. Stretching and range of motion exercises These exercises warm up your muscles and joints and improve the movement and flexibility of your foot. These exercises also help to relieve pain. Exercise A: Plantar fascia stretch  1. Sit with your left / right leg crossed over your opposite knee. 2. Hold your heel with one hand with that thumb near your arch. With your other hand, hold your toes and gently pull them back toward the top of your  foot. You should feel a stretch on the bottom of your toes or your foot or both. 3. Hold this stretch for__________ seconds. 4. Slowly release your toes and return to the starting position. Repeat __________ times. Complete this exercise __________ times a day. Exercise B: Gastroc, standing  1. Stand with your hands against a wall. 2. Extend your left / right leg behind you, and bend your front knee slightly. 3. Keeping your heels on the floor and keeping your back knee straight, shift your weight toward the wall without arching your back. You should feel a gentle stretch in your left / right calf. 4. Hold this position for __________ seconds. Repeat __________ times. Complete this exercise __________ times a day. Exercise C: Soleus, standing 1. Stand with your hands against a wall. 2. Extend your left / right leg behind you, and  bend your front knee slightly. 3. Keeping your heels on the floor, bend your back knee and slightly shift your weight over the back leg. You should feel a gentle stretch deep in your calf. 4. Hold this position for __________ seconds. Repeat __________ times. Complete this exercise __________ times a day. Exercise D: Gastrocsoleus, standing 1. Stand with the ball of your left / right foot on a step. The ball of your foot is on the walking surface, right under your toes. 2. Keep your other foot firmly on the same step. 3. Hold onto the wall or a railing for balance. 4. Slowly lift your other foot, allowing your body weight to press your heel down over the edge of the step. You should feel a stretch in your left / right calf. 5. Hold this position for __________ seconds. 6. Return both feet to the step. 7. Repeat this exercise with a slight bend in your left / right knee. Repeat __________ times with your left / right knee straight and __________ times with your left / right knee bent. Complete this exercise __________ times a day. Balance exercise This exercise  builds your balance and strength control of your arch to help take pressure off your plantar fascia. Exercise E: Single leg stand 1. Without shoes, stand near a railing or in a doorway. You may hold onto the railing or door frame as needed. 2. Stand on your left / right foot. Keep your big toe down on the floor and try to keep your arch lifted. Do not let your foot roll inward. 3. Hold this position for __________ seconds. 4. If this exercise is too easy, you can try it with your eyes closed or while standing on a pillow. Repeat __________ times. Complete this exercise __________ times a day. This information is not intended to replace advice given to you by your health care provider. Make sure you discuss any questions you have with your health care provider. Document Released: 10/03/2005 Document Revised: 06/07/2016 Document Reviewed: 08/17/2015 Elsevier Interactive Patient Education  2018 [employer].

## 2018-06-01 DIAGNOSIS — H04123 Dry eye syndrome of bilateral lacrimal glands: Secondary | ICD-10-CM | POA: Diagnosis not present

## 2018-06-01 DIAGNOSIS — H16143 Punctate keratitis, bilateral: Secondary | ICD-10-CM | POA: Diagnosis not present

## 2018-06-20 ENCOUNTER — Ambulatory Visit: Payer: Medicare Other | Admitting: Podiatry

## 2018-07-16 ENCOUNTER — Ambulatory Visit: Payer: Medicare Other | Admitting: Podiatry

## 2018-08-10 DIAGNOSIS — M0579 Rheumatoid arthritis with rheumatoid factor of multiple sites without organ or systems involvement: Secondary | ICD-10-CM | POA: Diagnosis not present

## 2018-08-10 DIAGNOSIS — Z79899 Other long term (current) drug therapy: Secondary | ICD-10-CM | POA: Diagnosis not present

## 2019-02-14 ENCOUNTER — Other Ambulatory Visit: Payer: Self-pay | Admitting: Urology

## 2019-03-14 ENCOUNTER — Telehealth (INDEPENDENT_AMBULATORY_CARE_PROVIDER_SITE_OTHER): Payer: Medicare Other | Admitting: Urology

## 2019-03-14 ENCOUNTER — Other Ambulatory Visit: Payer: Self-pay

## 2019-03-14 ENCOUNTER — Telehealth: Payer: Self-pay | Admitting: Urology

## 2019-03-14 DIAGNOSIS — N138 Other obstructive and reflux uropathy: Secondary | ICD-10-CM

## 2019-03-14 DIAGNOSIS — N419 Inflammatory disease of prostate, unspecified: Secondary | ICD-10-CM | POA: Diagnosis not present

## 2019-03-14 DIAGNOSIS — N529 Male erectile dysfunction, unspecified: Secondary | ICD-10-CM

## 2019-03-14 DIAGNOSIS — N401 Enlarged prostate with lower urinary tract symptoms: Secondary | ICD-10-CM

## 2019-03-14 DIAGNOSIS — R351 Nocturia: Secondary | ICD-10-CM

## 2019-03-14 MED ORDER — TADALAFIL 5 MG PO TABS: ORAL_TABLET | ORAL | 3 refills | Status: DC

## 2019-03-14 MED ORDER — SILDENAFIL CITRATE 100 MG PO TABS: 100.0000 mg | ORAL_TABLET | Freq: Every day | ORAL | 3 refills | Status: DC | PRN

## 2019-03-14 NOTE — Progress Notes (Signed)
Virtual Visit via Audio/Visual Note  I connected with Stuart Mcneil on 03/14/2019 at 1030  by audio/visual and verified that I am speaking with the correct person using two identifiers.  They are located at home.  I am located at my home.    This visit type was conducted due to national recommendations for restrictions regarding the COVID-19 Pandemic (e.g. social distancing).  This format is felt to be most appropriate for this patient at this time.  All issues noted in this document were discussed and addressed.  No physical exam was performed.   I discussed the limitations, risks, security and privacy concerns of performing an evaluation and management service by telephone and the availability of in person appointments. I also discussed with the patient that there may be a patient responsible charge related to this service. The patient expressed understanding and agreed to proceed.   History of Present Illness: Stuart Mcneil is a 78 year old Caucasian male with a history of chronic prostatitis, BPH with LU TS and nocturia who is contacted via [email or web address] due to the COVID-19 pandemic for his yearly office visit.    History of chronic prostatitis-no symptoms over the last year  BPH with LU TS-taking tadalafil 5 mg daily, Patient denies any gross hematuria, dysuria or suprapubic/flank pain.  Patient denies any fevers, chills, nausea or vomiting.   Nocturia- x 3  ED - desires a script for Viagra    Observations/Objective: Stuart Mcneil is appropriately dressed and does not appear distressed.  He is answering questions appropriately.   Assessment and Plan:  1. History of chronic prostatitis Asymptomatic at this time  2. BPH with LU TS Continue tadalafil 5 mg daily - [facility] - educated patient on the GoodRx app for his smart phone - discussed UroLift as he had seen in advertised in the paper - explained that he would need to have a cystoscopy and TRUSP to see if he was a  candidate for the procedure   3. Nocturia Explained that the UroLift may or may not reduce his nocturia as the condition is usually multifactorial  4. ED Script sent to [facility] for sildenafil 100 mg   Follow Up Instructions:  Stuart Mcneil will return in one year for I PSS, SHIM and exam.  He will contact the office if he would like to pursue the UroLift in the future.  I discussed the assessment and treatment plan with the patient. The patient was provided an opportunity to ask questions and all were answered. The patient agreed with the plan and demonstrated an understanding of the instructions.   The patient was advised to call back or seek an in-person evaluation if the symptoms worsen or if the condition fails to improve as anticipated.  I provided 27 minutes of face-to-face time during this encounter.   Tyrail Grandfield, PA-C

## 2019-03-14 NOTE — Telephone Encounter (Signed)
Would you call Stuart Mcneil and schedule him for a one year follow up for I PSS and exam?

## 2019-03-15 ENCOUNTER — Ambulatory Visit: Payer: Medicare Other | Admitting: Urology

## 2020-03-11 ENCOUNTER — Ambulatory Visit: Payer: Medicare Other | Admitting: Urology

## 2020-03-17 ENCOUNTER — Ambulatory Visit (INDEPENDENT_AMBULATORY_CARE_PROVIDER_SITE_OTHER): Payer: Medicare Other | Admitting: Urology

## 2020-03-17 ENCOUNTER — Encounter: Payer: Self-pay | Admitting: Urology

## 2020-03-17 ENCOUNTER — Encounter (INDEPENDENT_AMBULATORY_CARE_PROVIDER_SITE_OTHER): Payer: Self-pay

## 2020-03-17 ENCOUNTER — Other Ambulatory Visit: Payer: Self-pay

## 2020-03-17 VITALS — BP 128/71 | HR 69

## 2020-03-17 DIAGNOSIS — N138 Other obstructive and reflux uropathy: Secondary | ICD-10-CM

## 2020-03-17 DIAGNOSIS — N401 Enlarged prostate with lower urinary tract symptoms: Secondary | ICD-10-CM | POA: Diagnosis not present

## 2020-03-17 DIAGNOSIS — N529 Male erectile dysfunction, unspecified: Secondary | ICD-10-CM

## 2020-03-17 MED ORDER — SILDENAFIL CITRATE 100 MG PO TABS: 100.0000 mg | ORAL_TABLET | Freq: Every day | ORAL | 3 refills | Status: DC | PRN

## 2020-03-17 MED ORDER — TADALAFIL 5 MG PO TABS: ORAL_TABLET | ORAL | 3 refills | Status: DC

## 2020-03-17 NOTE — Progress Notes (Signed)
03/17/20 1:26 PM   Stuart Mcneil 04/11/1941 [id number]  Referring provider: Leonel Ramsay, MD [place],  [place] [address] Chief Complaint  Patient presents with  . Benign Prostatic Hypertrophy    HPI: Stuart Mcneil is a 79 y.o. male with a history of chronic prostatitis and BPH with LUTS who presents today for a 1 year follow-up.  BPH WITH LUTS His IPSS score previously was 8/2, which is moderate lower urinary tract symptomatology. He is mostly satisfied with his quality life due to his urinary symptoms.  His previous I PSS score was 10/2.  His previous PVR was 31 mL.  His major complaint today is frequency.  He denies any dysuria, hematuria or suprapubic pain.   He is currently taking cialis 5 mg daily.  He also denies any recent fevers, chills, nausea or vomiting.  He does not have a family history of PCa.  IPSS    Row Name 03/17/20 1300         International Prostate Symptom Score   How often have you had the sensation of not emptying your bladder?  Less than 1 in 5     How often have you had to urinate less than every two hours?  Less than 1 in 5 times     How often have you found you stopped and started again several times when you urinated?  Less than 1 in 5 times     How often have you found it difficult to postpone urination?  Less than 1 in 5 times     How often have you had a weak urinary stream?  Less than 1 in 5 times     How often have you had to strain to start urination?  Not at All     How many times did you typically get up at night to urinate?  3 Times     Total IPSS Score  8       Quality of Life due to urinary symptoms   If you were to spend the rest of your life with your urinary condition just the way it is now how would you feel about that?  Mostly Satisfied        Score:  1-7 Mild 8-19 Moderate 20-35 Severe  Erectile dysfunction SHIM score: 18     Main complaint: lack of firmness with erection x several years Risk  factors:  Age, BPH,  No painful erections or curvatures with his erections.    Still having/no longer having spontaneous erections. Tried: sildenafil and that's working successfully for him [name] Name 03/17/20 1306         SHIM: Over the last 6 months:   How do you rate your confidence that you could get and keep an erection?  Moderate     When you had erections with sexual stimulation, how often were your erections hard enough for penetration (entering your partner)?  Sometimes (about half the time)     During sexual intercourse, how often were you able to maintain your erection after you had penetrated (entered) your partner?  Sometimes (about half the time)     During sexual intercourse, how difficult was it to maintain your erection to completion of intercourse?  Slightly Difficult     When you attempted sexual intercourse, how often was it satisfactory for you?  Most Times (much more than half the time)       SHIM Total  Score   SHIM  17        PMH: Past Medical History:  Diagnosis Date  . Adenomatous colon polyp   . AR (allergic rhinitis)   . BPH (benign prostatic hyperplasia)   . ED (erectile dysfunction)   . HLD (hyperlipidemia)   . Prostatitis   . RA (rheumatoid arthritis) ([place])     Surgical History: Past Surgical History:  Procedure Laterality Date  . none      Home Medications:  Allergies as of 03/17/2020   No Known Allergies     Medication List       Accurate as of March 17, 2020  1:26 PM. If you have any questions, ask your nurse or doctor.        STOP taking these medications   aspirin EC 81 MG tablet Stopped by: Chrystle Murillo, PA-C   tamsulosin 0.4 MG Caps capsule Commonly known as: FLOMAX Stopped by: Zara Council, PA-C     TAKE these medications   Enbrel SureClick 50 MG/ML injection Generic drug: etanercept   etodolac 400 MG tablet Commonly known as: LODINE Reported on [id number]   folic acid 1 MG tablet Commonly known as:  FOLVITE   Glucosamine-Chondroitin 500-400 MG Caps Take by mouth.   methotrexate 2.5 MG tablet Commonly known as: RHEUMATREX   MULTIVITAMIN ADULT PO Take by mouth daily.   PriLOSEC 20 MG capsule Generic drug: omeprazole Take by mouth.   sildenafil 100 MG tablet Commonly known as: VIAGRA Take 1 tablet (100 mg total) by mouth daily as needed for erectile dysfunction.   tadalafil 5 MG tablet Commonly known as: CIALIS Take one tablet daily for benign prostatic hyperplasia   tiZANidine 2 MG tablet Commonly known as: ZANAFLEX Take 2 mg by mouth 3 (three) times daily.       Allergies: No Known Allergies  Family History: Family History  Problem Relation Age of Onset  . Diabetes Mellitus II Father   . Diabetes Mellitus II Sister   . Kidney disease Neg Hx   . Prostate cancer Neg Hx   . Bladder Cancer Neg Hx     Social History:  reports that he has quit smoking. He has never used smokeless tobacco. He reports that he does not drink alcohol or use drugs.  ROS For pertinent review of systems please refer to history of present illness  Physical Exam: BP 128/71   Pulse 69   Constitutional:  Alert and oriented, No acute distress. HEENT: [place] AT, mask is in place  Trachea midline. Cardiovascular: No clubbing, cyanosis, or edema. Respiratory: Normal respiratory effort, no increased work of breathing. GI: Abdomen is soft, nontender, nondistended, no abdominal masses GU: No CVA tenderness. He is uncircumcised and his foreskin is easily retracted Rectal: His prostate is greater than 60 grams. Could only palpate the apex and midportion of the gland. No nodules are appreciated. Seminal vesicles could not be palpated. Lymph: No inguinal lymphadenopathy. Skin: No rashes, bruises or suspicious lesions. Neurologic: Grossly intact, no focal deficits, moving all 4 extremities. Psychiatric: Normal mood and affect.  Laboratory Data: Serum creatinine 0.9 01/30/2020 Urinalysis negative  for micro heme on January 30, 2020 I have reviewed the labs.   Pertinent Imaging: No recent imaging  Assessment & Plan:    1. BPH with LU TS His IPSS score today is 8/2 and that is stable Continue tadalafil 5 mg daily - [facility] - educated patient on the  GoodRx app for his smart phone -  discussed UroLift as he had seen in advertised in the paper -explained that he would need to have a cystoscopy and TRUSP to see if he was a candidate for the procedure  Will follow up in 12 mos for I PSS and exam  2. ED SHIM score of 18 Continue viagra 100 mg, script sent to [facility] Will follow up in 12 months for [facility] score and exam   [place] [address], [place], [place] [address] [phone]  I, Joneen Boers Peace, am acting as a Education administrator for [employer], PA-C  I have reviewed the above documentation for accuracy and completeness, and I agree with the above.    Zara Council, PA-C

## 2020-04-09 ENCOUNTER — Other Ambulatory Visit: Payer: Self-pay | Admitting: Orthopedic Surgery

## 2020-04-09 DIAGNOSIS — S46291A Other injury of muscle, fascia and tendon of other parts of biceps, right arm, initial encounter: Secondary | ICD-10-CM

## 2021-03-16 NOTE — Progress Notes (Signed)
03/17/20 2:19 PM   Stuart Mcneil December 21, 1940 [id number]  Referring provider: Leonel Ramsay, MD [place],  [place] [address] Chief Complaint  Patient presents with  . Benign Prostatic Hypertrophy   Urology history: 1. BPH with LU TS -PSA 1.4 in 2018-aged out of screening -I PSS 19/3 -PVR 0 mL  -managed with tadalafil 5 mg daily and super-beta-prostate daily   2. ED -contributing factors of age and BPH -SHIM 3  -managed with sildenafil 100 mg, on-demand-dosing  HPI: Stuart Mcneil is a 80 y.o. male who presents today for a one year follow up.   He has been experiencing an increase in his nocturia over the last year.  He has gone from 3 times nightly to 5 times nightly.  He stream is also becoming weaker.  He has urgency associated with the nocturia.    Patient denies any modifying or aggravating factors.  Patient denies any gross hematuria, dysuria or suprapubic/flank pain.  Patient denies any fevers, chills, nausea or vomiting.    IPSS    Row Name 03/17/21 1300         International Prostate Symptom Score   How often have you had the sensation of not emptying your bladder? Less than half the time     How often have you had to urinate less than every two hours? About half the time     How often have you found you stopped and started again several times when you urinated? About half the time     How often have you found it difficult to postpone urination? Less than half the time     How often have you had a weak urinary stream? About half the time     How often have you had to strain to start urination? Less than half the time     How many times did you typically get up at night to urinate? 4 Times     Total IPSS Score 19           Quality of Life due to urinary symptoms   If you were to spend the rest of your life with your urinary condition just the way it is now how would you feel about that? Mixed            Score:  1-7 Mild 8-19  Moderate 20-35 Severe  Patient still having an occasional spontaneous erections.  He denies any pain or curvature with erections.  There are times when he feels the sildenafil is not effective, but he attributes that to fatigue and other issues as well.    SHIM    Row Name 03/17/21 1340         SHIM: Over the last 6 months:   How do you rate your confidence that you could get and keep an erection? High     When you had erections with sexual stimulation, how often were your erections hard enough for penetration (entering your partner)? Sometimes (about half the time)     During sexual intercourse, how often were you able to maintain your erection after you had penetrated (entered) your partner? Sometimes (about half the time)     During sexual intercourse, how difficult was it to maintain your erection to completion of intercourse? Difficult     When you attempted sexual intercourse, how often was it satisfactory for you? Sometimes (about half the time)           SHIM  Total Score   SHIM 16             Score: 1-7 Severe ED 8-11 Moderate ED 12-16 Mild-Moderate ED 17-21 Mild ED 22-25 No ED  PMH: Past Medical History:  Diagnosis Date  . Adenomatous colon polyp   . AR (allergic rhinitis)   . BPH (benign prostatic hyperplasia)   . ED (erectile dysfunction)   . HLD (hyperlipidemia)   . Prostatitis   . RA (rheumatoid arthritis) ([place])     Surgical History: Past Surgical History:  Procedure Laterality Date  . none      Home Medications:  Allergies as of 03/17/2021   No Known Allergies     Medication List       Accurate as of March 17, 2021  2:19 PM. If you have any questions, ask your nurse or doctor.        etanercept 50 MG/ML injection Commonly known as: ENBREL   etodolac 400 MG tablet Commonly known as: LODINE Reported on 06/03/5630   folic acid 1 MG tablet Commonly known as: FOLVITE   Glucosamine-Chondroitin 500-400 MG Caps Take by mouth.   methotrexate  2.5 MG tablet Commonly known as: RHEUMATREX   mirabegron ER 50 MG Tb24 tablet Commonly known as: MYRBETRIQ Take 1 tablet (50 mg total) by mouth daily. Started by: Zara Council, PA-C   MULTIVITAMIN ADULT PO Take by mouth daily.   omeprazole 20 MG capsule Commonly known as: PRILOSEC Take by mouth.   sildenafil 100 MG tablet Commonly known as: VIAGRA Take 1 tablet (100 mg total) by mouth daily as needed for erectile dysfunction.   tadalafil 5 MG tablet Commonly known as: CIALIS Take one tablet daily for benign prostatic hyperplasia   tiZANidine 2 MG tablet Commonly known as: ZANAFLEX Take 2 mg by mouth 3 (three) times daily.       Allergies: No Known Allergies  Family History: Family History  Problem Relation Age of Onset  . Diabetes Mellitus II Father   . Diabetes Mellitus II Sister   . Kidney disease Neg Hx   . Prostate cancer Neg Hx   . Bladder Cancer Neg Hx     Social History:  reports that he has quit smoking. He has never used smokeless tobacco. He reports that he does not drink alcohol and does not use drugs.  ROS For pertinent review of systems please refer to history of present illness  Physical Exam: BP (!) 153/80   Pulse 79   Ht 6' (1.829 m)   Wt 155 lb (70.3 kg)   BMI 21.02 kg/m   Constitutional:  Well nourished. Alert and oriented, No acute distress. HEENT: [place] AT, mask in place.  Trachea midline Cardiovascular: No clubbing, cyanosis, or edema. Respiratory: Normal respiratory effort, no increased work of breathing. GU: No CVA tenderness.  No bladder fullness or masses.  Patient with uncircumcised phallus.  Foreskin easily retracted Urethral meatus is patent.  No penile discharge. No penile lesions or rashes. Scrotum without lesions, cysts, rashes and/or edema.  Testicles are located scrotally bilaterally. No masses are appreciated in the testicles. Left and right epididymis are normal. Rectal: Patient with  normal sphincter tone. Anus and  perineum without scarring or rashes. No rectal masses are appreciated. Prostate is approximately 60 + grams, could only palpate the apex and midportion of gland, no nodules are appreciated. Seminal vesicles are normal. Skin: No rashes, bruises or suspicious lesions. Lymph: No inguinal adenopathy. Neurologic: Grossly intact, no focal deficits, moving all 4  extremities. Psychiatric: Normal mood and affect.  Laboratory Data: WBC (White Blood Cell Count) 4.1 - 10.2 10^3/uL 5.2   RBC (Red Blood Cell Count) 4.69 - 6.13 10^6/uL 4.24Low   Hemoglobin 14.1 - 18.1 gm/dL 13.8Low   Hematocrit 40.0 - 52.0 % 40.4   MCV (Mean Corpuscular Volume) 80.0 - 100.0 fl 95.3   MCH (Mean Corpuscular Hemoglobin) 27.0 - 31.2 pg 32.5High   MCHC (Mean Corpuscular Hemoglobin Concentration) 32.0 - 36.0 gm/dL 34.2   Platelet Count 150 - 450 10^3/uL 247   RDW-CV (Red Cell Distribution Width) 11.6 - 14.8 % 13.7   MPV (Mean Platelet Volume) 9.4 - 12.4 fl 8.8Low   Neutrophils 1.50 - 7.80 10^3/uL 3.29   Lymphocytes 1.00 - 3.60 10^3/uL 1.30   Monocytes 0.00 - 1.50 10^3/uL 0.49   Eosinophils 0.00 - 0.55 10^3/uL 0.07   Basophils 0.00 - 0.09 10^3/uL 0.05   Neutrophil % 32.0 - 70.0 % 63.1   Lymphocyte % 10.0 - 50.0 % 25.0   Monocyte % 4.0 - 13.0 % 9.4   Eosinophil % 1.0 - 5.0 % 1.3   Basophil% 0.0 - 2.0 % 1.0   Immature Granulocyte % <=0.7 % 0.2   Immature Granulocyte Count <=0.06 10^3/L 0.01   Resulting Agency  [facility]  Specimen Collected: 01/28/21 9:05 AM Last Resulted: 01/28/21 10:03 AM  Received From: [facility]  Result Received: 03/16/21 9:47 AM   Specimen:  Blood  Ref Range & Units 1 mo ago  Creatinine 0.7 - 1.3 mg/dL 0.9   Glomerular Filtration Rate (eGFR), MDRD Estimate >60 mL/min/1.73sq m 82   Resulting Agency  [facility]  Specimen Collected: 01/28/21 9:05 AM Last Resulted: 01/28/21 11:23 AM  Received From: [facility]  Result  Received: 03/16/21 9:47 AM   Cholesterol, Total 100 - 200 mg/dL 178   Triglyceride 35 - 199 mg/dL 91   HDL (High Density Lipoprotein) Cholesterol 29.0 - 71.0 mg/dL 43.4   LDL Calculated 0 - 130 mg/dL 116   VLDL Cholesterol mg/dL 18   Cholesterol/HDL Ratio  4.1   Resulting Agency  [facility]  Specimen Collected: 12/09/20 8:30 AM Last Resulted: 12/09/20 10:16 AM  Received From: [facility]  Result Received: 03/16/21 9:47 AM   Color Yellow, Violet, Light Violet, Dark Violet Yellow   Clarity Clear Clear   Specific Gravity 1.000 - 1.030 1.020   pH, Urine 5.0 - 8.0 6.0   Protein, Urinalysis Negative, Trace mg/dL Negative   Glucose, Urinalysis Negative mg/dL Negative   Ketones, Urinalysis Negative mg/dL Negative   Blood, Urinalysis Negative Negative   Nitrite, Urinalysis Negative Negative   Leukocyte Esterase, Urinalysis Negative Negative   White Blood Cells, Urinalysis None Seen, 0-3 /hpf 0-3   Red Blood Cells, Urinalysis None Seen, 0-3 /hpf None Seen   Bacteria, Urinalysis None Seen /hpf RareAbnormal   Squamous Epithelial Cells, Urinalysis Rare, Few, None Seen /hpf None Seen   Resulting Agency  [facility] - [facility]   Narrative Performed by [facility] Moderate mucus  Specimen Collected: 12/09/20 8:30 AM Last Resulted: 12/09/20 9:04 AM  Received From: [facility]  Result Received: 03/16/21 9:47 AM  I have reviewed the labs.   Pertinent Imaging: No recent imaging  Assessment & Plan:    1. BPH with LU TS -Discussed that as his PVR in the office was 0 mL he is either having  increased residual at night causing the nocturia, an irritated bladder secondary to enlarged prostate or sleep apnea that may be causing his symptoms -Discussed at this time we could either add finasteride to decrease the size of his prostate or an overactive bladder agent or undergo cystoscopy for further evaluation -He would like  to try an overactive bladder agent and I given Myrbetriq 50 mg samples #28  2. ED -continue viagra 100 mg, script sent to [facility] [address] [place], [place] [address] [phone]  Zara Council, PA-C

## 2021-03-17 ENCOUNTER — Ambulatory Visit (INDEPENDENT_AMBULATORY_CARE_PROVIDER_SITE_OTHER): Payer: Medicare Other | Admitting: Urology

## 2021-03-17 ENCOUNTER — Other Ambulatory Visit: Payer: Self-pay

## 2021-03-17 ENCOUNTER — Encounter: Payer: Self-pay | Admitting: Urology

## 2021-03-17 VITALS — BP 153/80 | HR 79 | Ht 72.0 in | Wt 155.0 lb

## 2021-03-17 DIAGNOSIS — N138 Other obstructive and reflux uropathy: Secondary | ICD-10-CM

## 2021-03-17 DIAGNOSIS — N529 Male erectile dysfunction, unspecified: Secondary | ICD-10-CM

## 2021-03-17 DIAGNOSIS — N401 Enlarged prostate with lower urinary tract symptoms: Secondary | ICD-10-CM

## 2021-03-17 MED ORDER — SILDENAFIL CITRATE 100 MG PO TABS: 100.0000 mg | ORAL_TABLET | Freq: Every day | ORAL | 3 refills | Status: AC | PRN

## 2021-03-17 MED ORDER — TADALAFIL 5 MG PO TABS: ORAL_TABLET | ORAL | 3 refills | Status: AC

## 2021-03-17 MED ORDER — MIRABEGRON ER 50 MG PO TB24: 50.0000 mg | ORAL_TABLET | Freq: Every day | ORAL | 0 refills | Status: DC

## 2021-04-06 NOTE — Progress Notes (Signed)
03/17/20 1:54 PM   Stuart Mcneil 06/02/1941 [id number]  Referring provider: Leonel Ramsay, MD [place],  [place] [address] Chief Complaint  Patient presents with   Benign Prostatic Hypertrophy    Urology history: 1. BPH with LU TS -PSA 1.4 in 2018-aged out of screening -I PSS 19/3 -PVR 0 mL  -managed with tadalafil 5 mg daily and super-beta-prostate daily   2. ED -contributing factors of age and BPH -SHIM 17  -managed with sildenafil 100 mg, on-demand-dosing   HPI: Stuart Mcneil is a 80 y.o. male who presents today for follow up after starting Myrbetriq for nocturia.   He feels the Myrbetriq 50 mg has helped decrease his nocturia.  He stated he noticed a difference over the last 2 weeks that he has been taking it.  Patient denies any modifying or aggravating factors.  Patient denies any gross hematuria, dysuria or suprapubic/flank pain.  Patient denies any fevers, chills, nausea or vomiting.        IPSS     Row Name 04/07/21 1300         International Prostate Symptom Score   How often have you had the sensation of not emptying your bladder? Less than 1 in 5     How often have you had to urinate less than every two hours? About half the time     How often have you found you stopped and started again several times when you urinated? Less than half the time     How often have you found it difficult to postpone urination? Less than 1 in 5 times     How often have you had a weak urinary stream? Less than half the time     How often have you had to strain to start urination? Less than 1 in 5 times     How many times did you typically get up at night to urinate? 4 Times     Total IPSS Score 14           Quality of Life due to urinary symptoms     If you were to spend the rest of your life with your urinary condition just the way it is now how would you feel about that? Mixed              Score:  1-7 Mild 8-19 Moderate 20-35  Severe   PMH: Past Medical History:  Diagnosis Date   Adenomatous colon polyp    AR (allergic rhinitis)    BPH (benign prostatic hyperplasia)    ED (erectile dysfunction)    HLD (hyperlipidemia)    Prostatitis    RA (rheumatoid arthritis) ([place])     Surgical History: Past Surgical History:  Procedure Laterality Date   none      Home Medications:  Allergies as of 04/07/2021   No Known Allergies      Medication List        Accurate as of April 07, 2021  1:54 PM. If you have any questions, ask your nurse or doctor.          etanercept 50 MG/ML injection Commonly known as: ENBREL   etodolac 400 MG tablet Commonly known as: LODINE Reported on 9/47/6546   folic acid 1 MG tablet Commonly known as: FOLVITE   Glucosamine-Chondroitin 500-400 MG Caps Take by mouth.   methotrexate 2.5 MG tablet Commonly known as: RHEUMATREX   mirabegron ER 50 MG Tb24 tablet Commonly known as:  MYRBETRIQ Take 1 tablet (50 mg total) by mouth daily.   MULTIVITAMIN ADULT PO Take by mouth daily.   omeprazole 20 MG capsule Commonly known as: PRILOSEC Take by mouth.   sildenafil 100 MG tablet Commonly known as: VIAGRA Take 1 tablet (100 mg total) by mouth daily as needed for erectile dysfunction.   tadalafil 5 MG tablet Commonly known as: CIALIS Take one tablet daily for benign prostatic hyperplasia   tiZANidine 2 MG tablet Commonly known as: ZANAFLEX Take 2 mg by mouth 3 (three) times daily.        Allergies: No Known Allergies  Family History: Family History  Problem Relation Age of Onset   Diabetes Mellitus II Father    Diabetes Mellitus II Sister    Kidney disease Neg Hx    Prostate cancer Neg Hx    Bladder Cancer Neg Hx     Social History:  reports that he has quit smoking. He has never used smokeless tobacco. He reports that he does not drink alcohol and does not use drugs.  ROS For pertinent review of systems please refer to history of present  illness  Physical Exam: BP (!) 159/79   Pulse 76   Ht 5\' 11"  (1.803 m)   Wt 153 lb (69.4 kg)   BMI 21.34 kg/m   Constitutional:  Well nourished. Alert and oriented, No acute distress. HEENT: [place] AT, mask in place.  Trachea midline Cardiovascular: No clubbing, cyanosis, or edema. Respiratory: Normal respiratory effort, no increased work of breathing. Neurologic: Grossly intact, no focal deficits, moving all 4 extremities. Psychiatric: Normal mood and affect.   Laboratory Data: No new labs since last visit   Pertinent Imaging: No recent imaging  Assessment & Plan:     1. BPH with LU TS -Continue Myrbetriq 50 mg daily -Continue tadalafil 5 mg daily  2. ED -continue viagra 100 mg, on-demand dosing  Follow-up in 1 year for IPSS, SH IM and PVR  [facility] [address] [place], [place] [address] [phone]  Zara Council, PA-C

## 2021-04-07 ENCOUNTER — Encounter: Payer: Self-pay | Admitting: Urology

## 2021-04-07 ENCOUNTER — Ambulatory Visit (INDEPENDENT_AMBULATORY_CARE_PROVIDER_SITE_OTHER): Payer: Medicare Other | Admitting: Urology

## 2021-04-07 ENCOUNTER — Other Ambulatory Visit: Payer: Self-pay

## 2021-04-07 VITALS — BP 159/79 | HR 76 | Ht 71.0 in | Wt 153.0 lb

## 2021-04-07 DIAGNOSIS — N138 Other obstructive and reflux uropathy: Secondary | ICD-10-CM

## 2021-04-07 DIAGNOSIS — R351 Nocturia: Secondary | ICD-10-CM | POA: Diagnosis not present

## 2021-04-07 DIAGNOSIS — N401 Enlarged prostate with lower urinary tract symptoms: Secondary | ICD-10-CM | POA: Diagnosis not present

## 2021-04-07 LAB — BLADDER SCAN AMB NON-IMAGING

## 2021-04-07 MED ORDER — MIRABEGRON ER 50 MG PO TB24: 50.0000 mg | ORAL_TABLET | Freq: Every day | ORAL | 3 refills | Status: DC

## 2021-04-07 MED ORDER — MIRABEGRON ER 50 MG PO TB24: 50.0000 mg | ORAL_TABLET | Freq: Every day | ORAL | 3 refills | Status: AC

## 2021-08-04 ENCOUNTER — Other Ambulatory Visit: Payer: Self-pay | Admitting: Family Medicine

## 2021-08-04 ENCOUNTER — Other Ambulatory Visit (HOSPITAL_COMMUNITY): Payer: Self-pay | Admitting: Family Medicine

## 2021-08-04 DIAGNOSIS — M5412 Radiculopathy, cervical region: Secondary | ICD-10-CM

## 2021-08-12 ENCOUNTER — Ambulatory Visit
Admission: RE | Admit: 2021-08-12 | Discharge: 2021-08-12 | Disposition: A | Discharge status: Home / Self Care | Payer: Medicare Other | Source: Ambulatory Visit | Attending: Family Medicine | Admitting: Family Medicine

## 2021-08-12 ENCOUNTER — Other Ambulatory Visit: Payer: Self-pay

## 2021-08-12 DIAGNOSIS — M5412 Radiculopathy, cervical region: Secondary | ICD-10-CM | POA: Diagnosis present

## 2021-08-12 IMAGING — MR MR CERVICAL SPINE W/O CM
5 series · 36 of 48 positions shown · non-contrast
Comparison: Report only from outside radiographs [DATE].
Abdominopelvic CT [DATE].

CLINICAL DATA: Neck pain radiating into both arms for 1 month. No
known injury. History of arthritis. Reported C2 sclerosis on outside
radiographs; no given history of malignancy.

EXAM:
MRI CERVICAL SPINE WITHOUT CONTRAST
TECHNIQUE: Multiplanar, multisequence MR imaging of the cervical spine was
performed. No intravenous contrast was administered.

[Series 5: T2 · sagittal · 3.0mm · 0.62mm/px · 6 of 15 slices shown (1 of 2)]
[im 1/15]
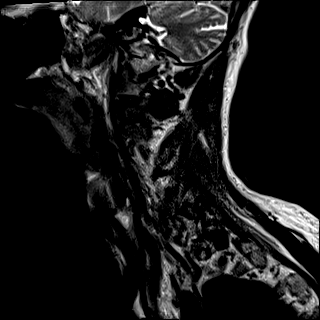
[im 3/15]
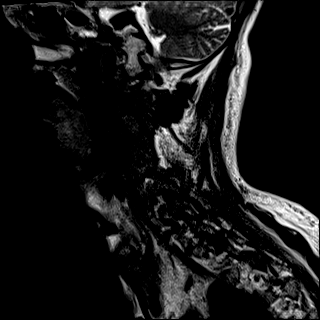
[im 6/15]
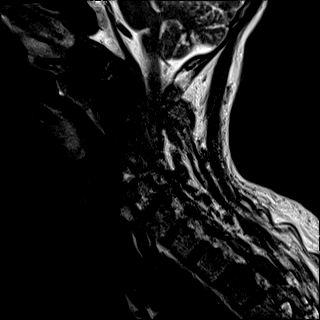
[im 9/15]
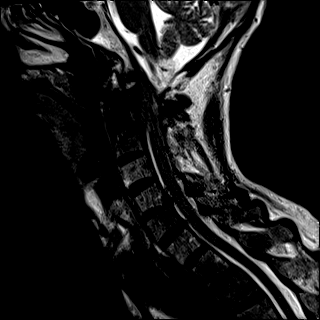
[im 12/15]
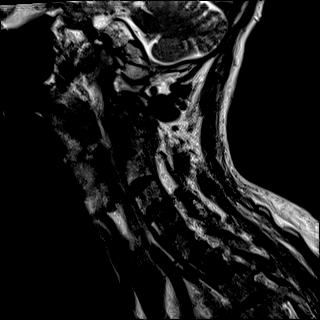
[im 15/15]
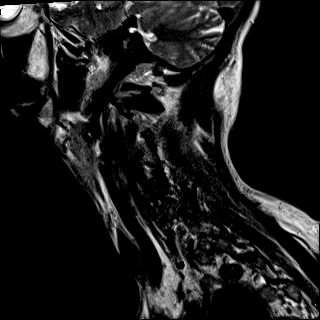

[Series 6: FLAIR · sagittal · 3.0mm · 0.78mm/px · 5 of 15 slices shown]
[im 1/15]
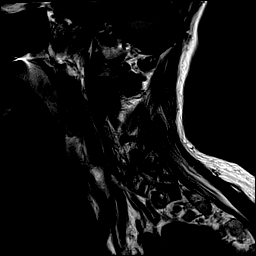
[im 4/15]
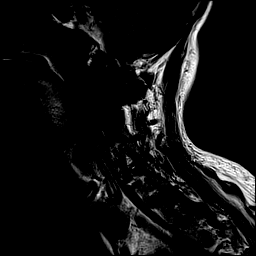
[im 8/15]
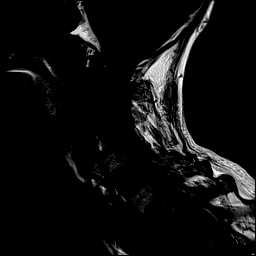
[im 11/15]
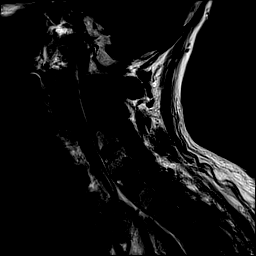
[im 15/15]
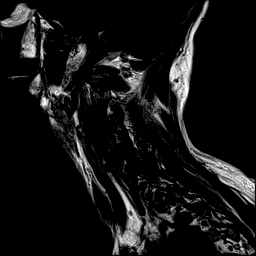

[Series 7: STIR · sagittal · 3.0mm · 0.62mm/px · 5 of 15 slices shown]
[im 1/15]
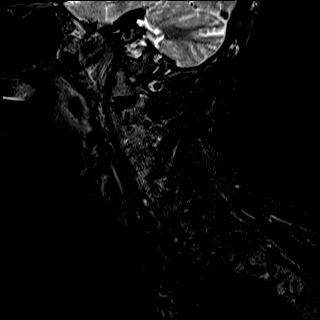
[im 4/15]
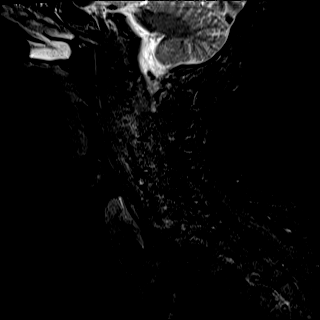
[im 8/15]
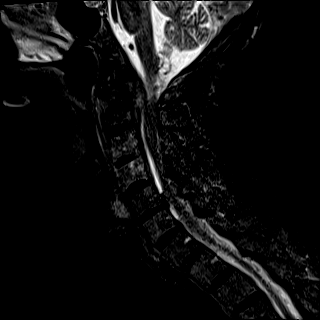
[im 11/15]
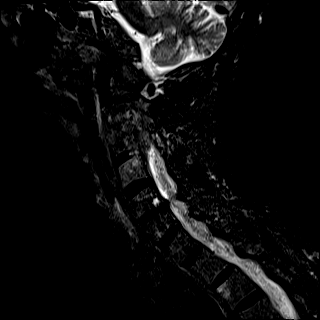
[im 15/15]
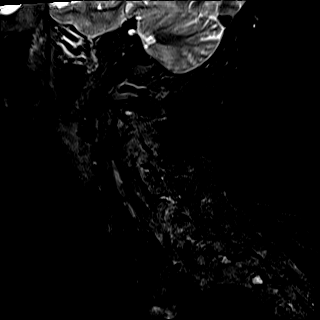

[Series 8: T2 · axial · 3.0mm · 0.70mm/px · z∈[-16,+122]mm · 12 of 43 slices shown (2 of 2)]
[im 1/43]
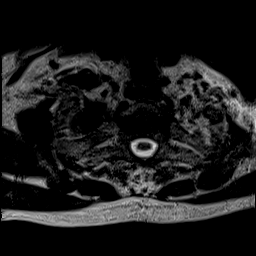
[im 3/43]
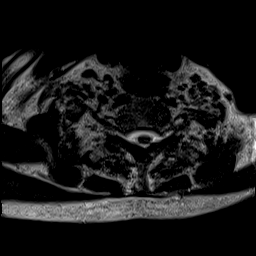
[im 6/43]
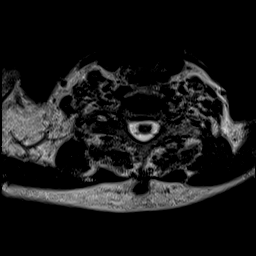
[im 9/43]
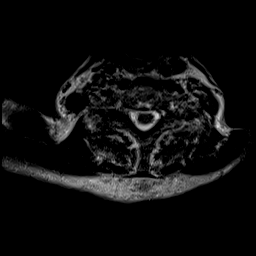
[im 12/43]
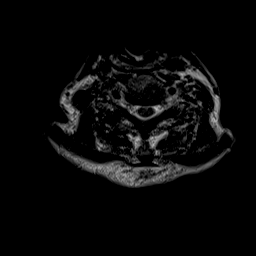
[im 15/43]
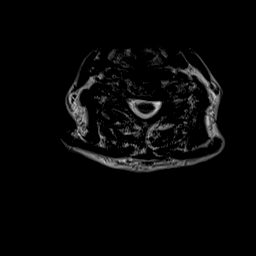
[im 20/43]
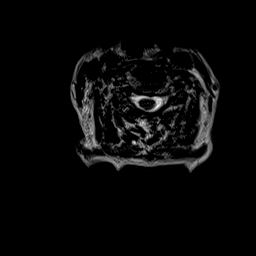
[im 23/43]
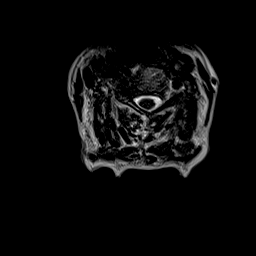
[im 26/43]
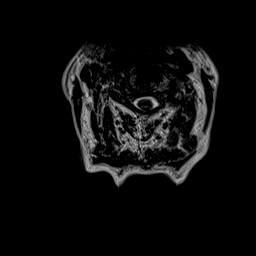
[im 31/43]
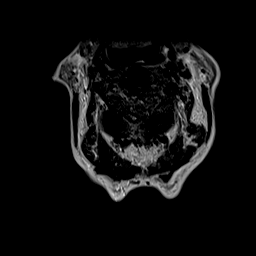
[im 37/43]
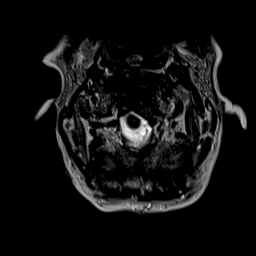
[im 43/43]
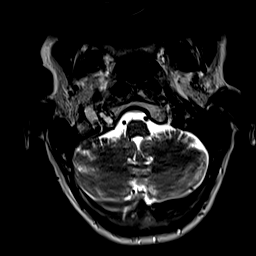

[Series 9: ax mpgr · axial · 3.0mm · 0.35mm/px · z∈[-9,+122]mm · 8 of 43 slices shown]
[im 3/43]
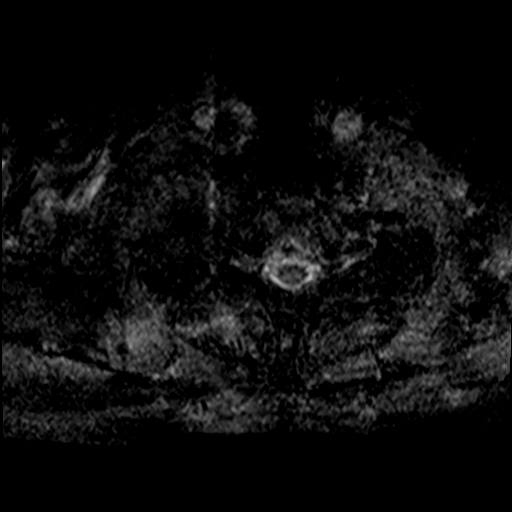
[im 9/43]
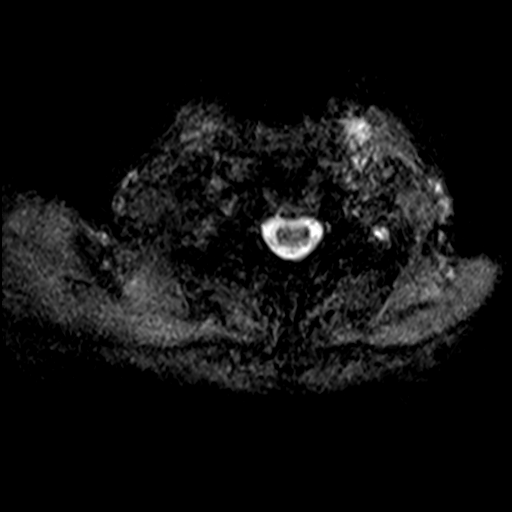
[im 15/43]
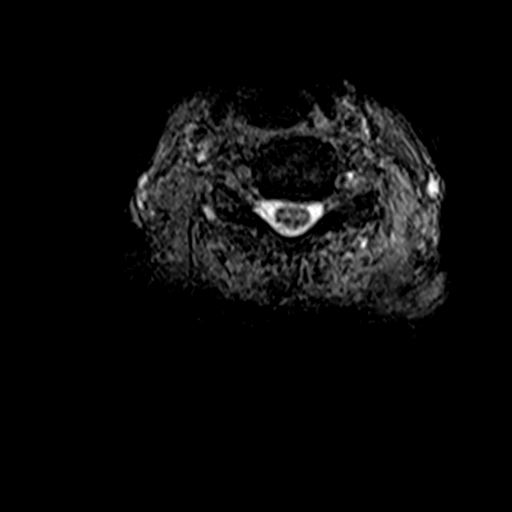
[im 20/43]
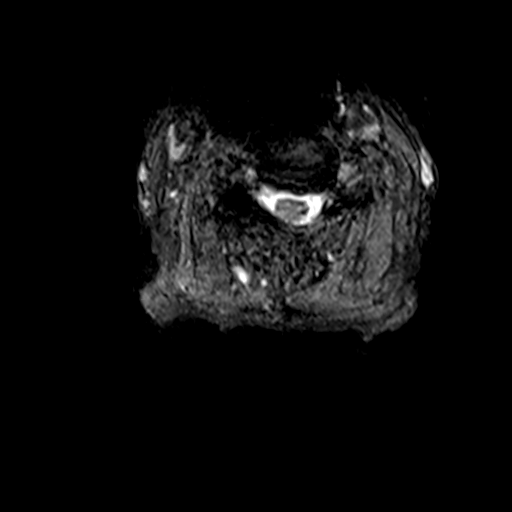
[im 26/43]
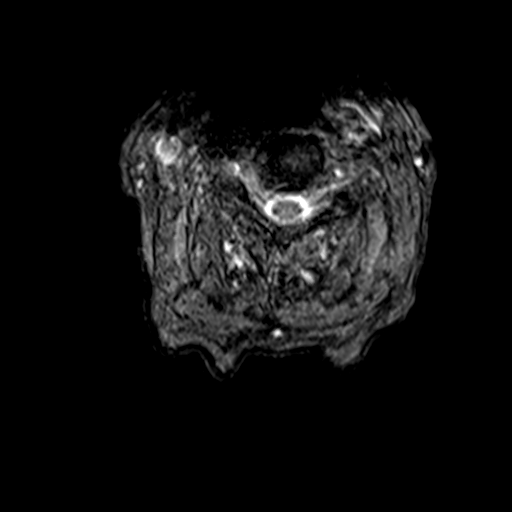
[im 31/43]
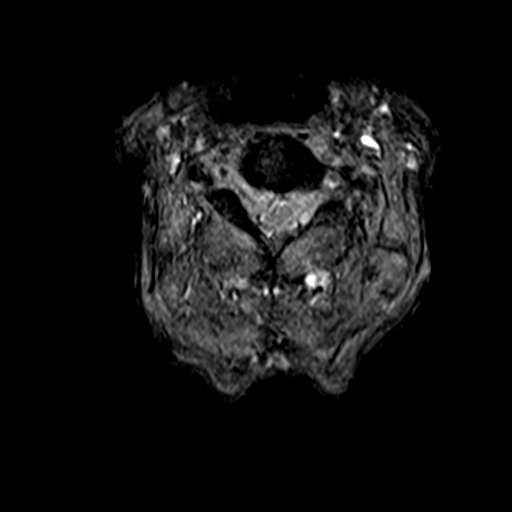
[im 37/43]
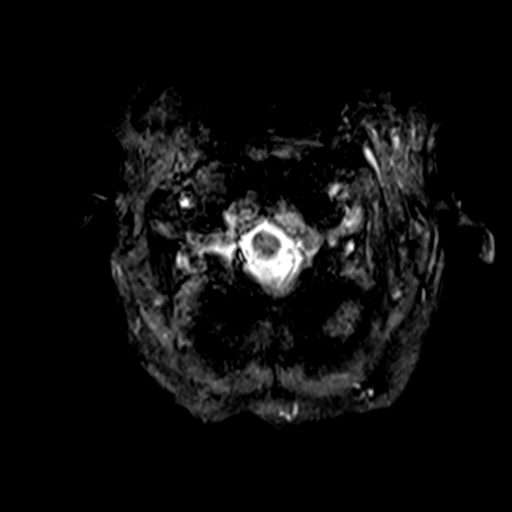
[im 43/43]
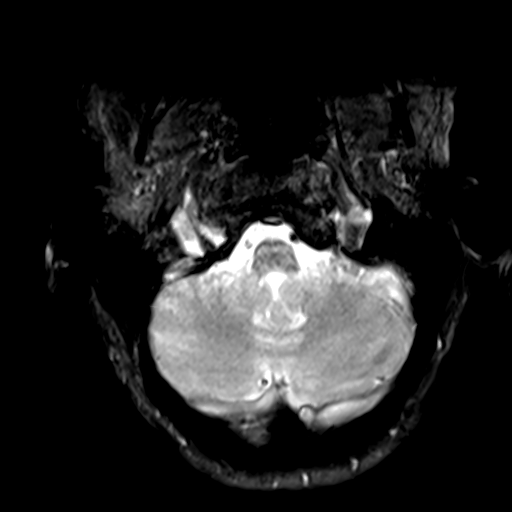

[36 of 48 positions shown; findings below may reference images not displayed]

FINDINGS: Alignment: Physiologic.

Vertebrae: There is diffuse replacement of the normal marrow signal
within the C2 vertebral body and its posterior elements, best seen
on the T1 weighted images. There is similar diffuse replacement of
the normal marrow signal within the C3 vertebral body, and other
smaller lesions are present throughout the cervical and upper
thoracic spine. Findings are consistent with diffuse metastatic
disease. There is associated significant anterior epidural tumor
anteriorly from C2 through C4. No significant pathologic fracture
identified.

Cord: The CSF surrounding the cord is effaced by the epidural tumor
at C2. No definite cord compression or abnormal cord signal.

Posterior Fossa, vertebral arteries, paraspinal tissues: Visualized
portions of the posterior fossa appear unremarkable. No significant
paraspinal findings. Bilateral vertebral artery flow voids.

Disc levels:

C2-3: The disc appears normal. As above, there is significant
anterior epidural tumor bilaterally related to the extensive
metastatic disease involving the C2 and C3 vertebral bodies. There
is effacement of the CSF surrounding the cord as well as foraminal
extension of epidural tumor bilaterally.

C3-4: The disc appears normal. There is lesser anterior epidural
tumor at this level with biforaminal extension of tumor. No cord
deformity.

C4-5: Mild disc bulging and uncinate spurring asymmetric to the
right. Mild right foraminal narrowing. No cord deformity.

C5-6: Spondylosis with loss of disc height, uncinate spurring and
bilateral facet hypertrophy. Resulting mild spinal stenosis with
left greater than right foraminal narrowing.

C6-7: Mild disc bulging and facet hypertrophy. No significant spinal
stenosis or nerve root encroachment.

C7-T1: Mild bilateral facet hypertrophy. No significant spinal
stenosis or nerve root encroachment.
IMPRESSION: 1. Widespread metastatic disease throughout the cervical spine as
described. Given reported sclerosis on prior radiographs, consider
metastatic prostate cancer. Correlate with serum PSA levels.
2. There is significant associated anterior epidural tumor from C2
through C4 with associated mass effect on the thecal sac and
biforaminal extension of tumor at C2-3 and C3-4. These findings
place the patient at risk for cord compression which is not
definitely demonstrated at this time. Neurosurgical evaluation
recommended.
3. Relatively mild spondylosis, greatest at C5-6 and C6-7.
4. These results will be called to the ordering clinician or
representative by the Radiologist Assistant, and communication
documented in the PACS or [REDACTED].

## 2021-08-20 ENCOUNTER — Other Ambulatory Visit (HOSPITAL_COMMUNITY): Payer: Self-pay | Admitting: Neurosurgery

## 2021-08-20 ENCOUNTER — Other Ambulatory Visit: Payer: Self-pay | Admitting: Neurosurgery

## 2021-08-20 DIAGNOSIS — C7951 Secondary malignant neoplasm of bone: Secondary | ICD-10-CM

## 2021-08-23 ENCOUNTER — Other Ambulatory Visit: Payer: Self-pay

## 2021-08-23 ENCOUNTER — Ambulatory Visit
Admission: RE | Admit: 2021-08-23 | Discharge: 2021-08-23 | Disposition: A | Discharge status: Home / Self Care | Payer: Medicare Other | Source: Ambulatory Visit | Attending: Neurosurgery | Admitting: Neurosurgery

## 2021-08-23 DIAGNOSIS — C7951 Secondary malignant neoplasm of bone: Secondary | ICD-10-CM

## 2021-08-23 IMAGING — MR MR LUMBAR SPINE WO/W CM
4 of 5 series · 31 of 48 positions shown · IV contrast (6ml Gadavist)
Comparison: Cervical spine MRI [DATE].

CLINICAL DATA: 80-year-old male with prostate cancer. Constant neck
and spine pain. Abnormal cervical spine MRI last month.

EXAM:
MRI TOTAL SPINE WITHOUT AND WITH CONTRAST
TECHNIQUE: Multisequence MR imaging of the spine from the cervical spine to the
sacrum was performed prior to and following IV contrast
administration for evaluation of spinal metastatic disease.
CONTRAST:  6 mL Gadavist

[Series 13: T1 fat-sat post-contrast · sagittal · 4.0mm · 1.45mm/px · 9 of 19 slices shown (1 of 2)]
[im 1/19]
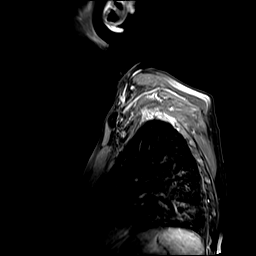
[im 3/19]
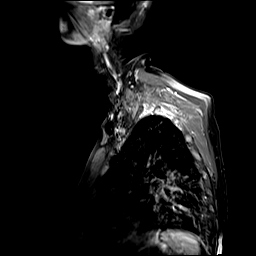
[im 5/19]
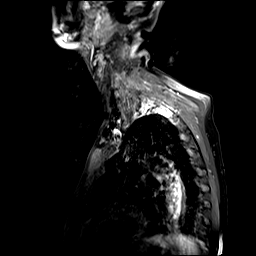
[im 7/19]
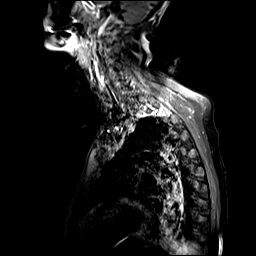
[im 10/19]
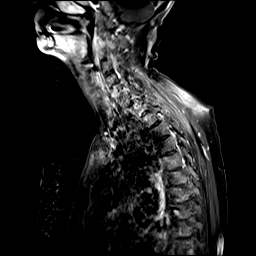
[im 12/19]
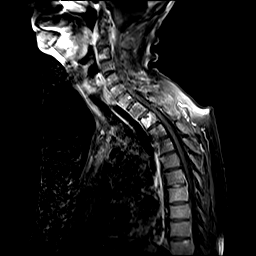
[im 14/19]
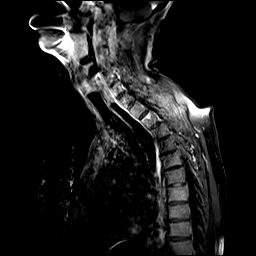
[im 16/19]
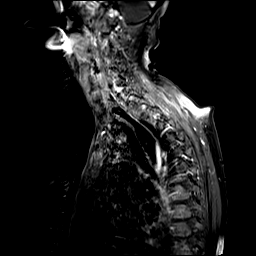
[im 19/19]
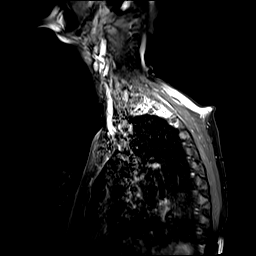

[Series 14: T1 fat-sat post-contrast · sagittal · 4.0mm · 1.45mm/px · 8 of 19 slices shown (2 of 2)]
[im 1/19]
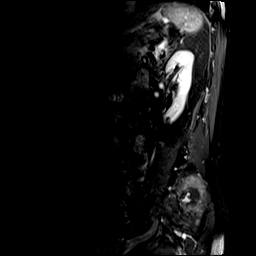
[im 3/19]
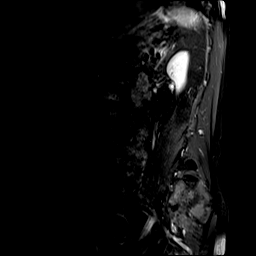
[im 6/19]
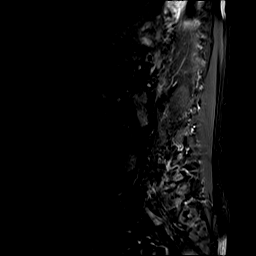
[im 8/19]
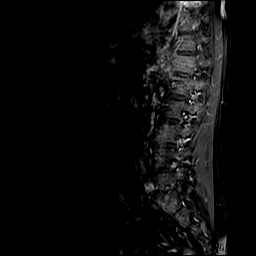
[im 11/19]
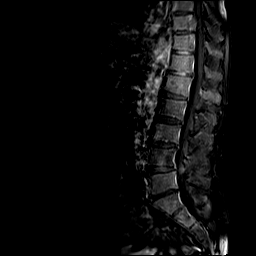
[im 13/19]
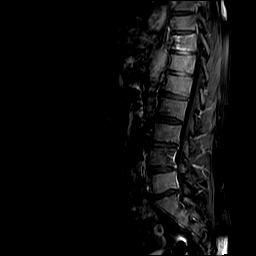
[im 16/19]
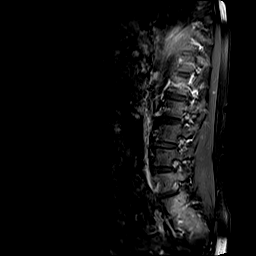
[im 19/19]
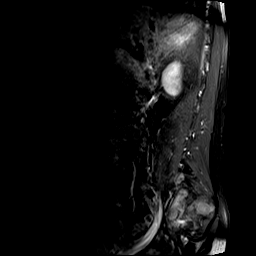

[Series 15: T1 · sagittal · 5.0mm · 1.45mm/px · 7 of 18 slices shown]
[im 1/18]
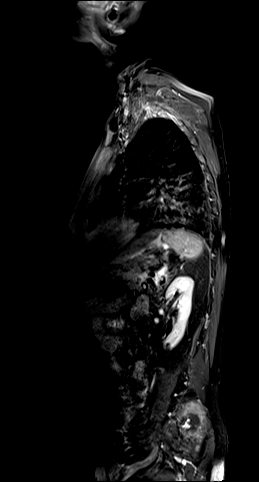
[im 3/18]
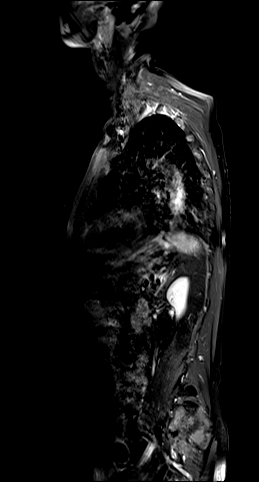
[im 6/18]
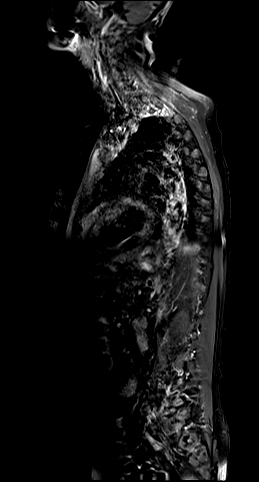
[im 9/18]
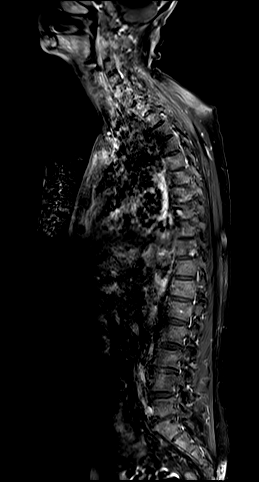
[im 12/18]
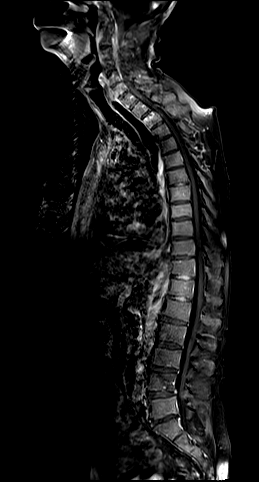
[im 15/18]
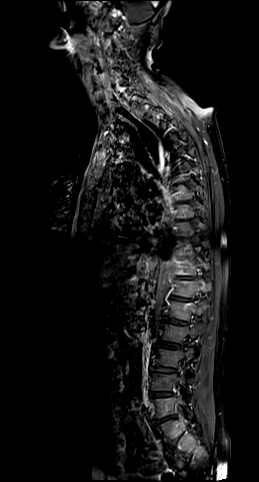
[im 18/18]
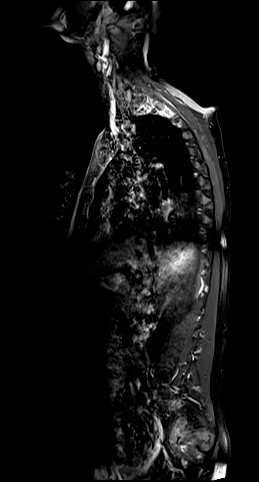

[Series 17: T1 post-contrast · axial · 3.0mm · 0.35mm/px · z∈[-72,+23]mm · 7 of 35 slices shown]
[im 1/35]
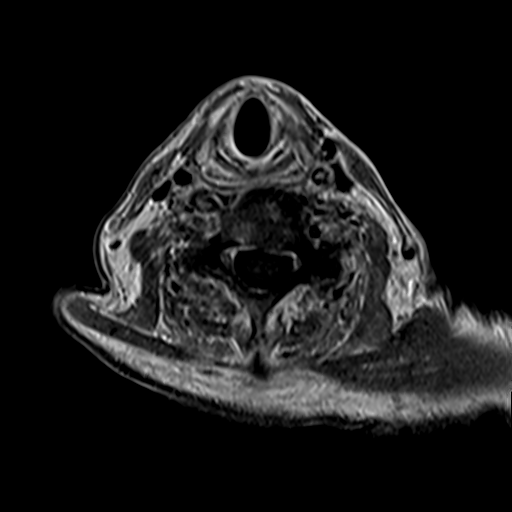
[im 6/35]
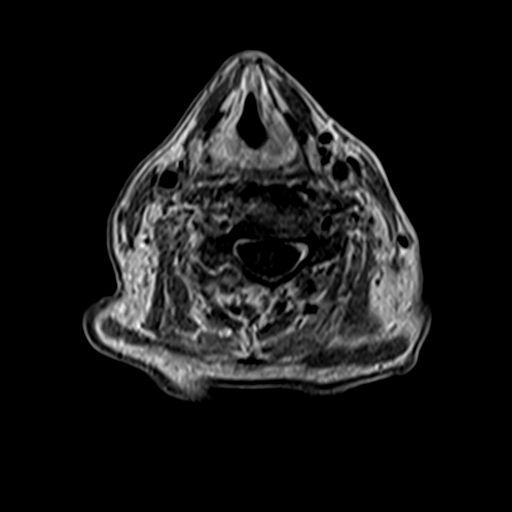
[im 11/35]
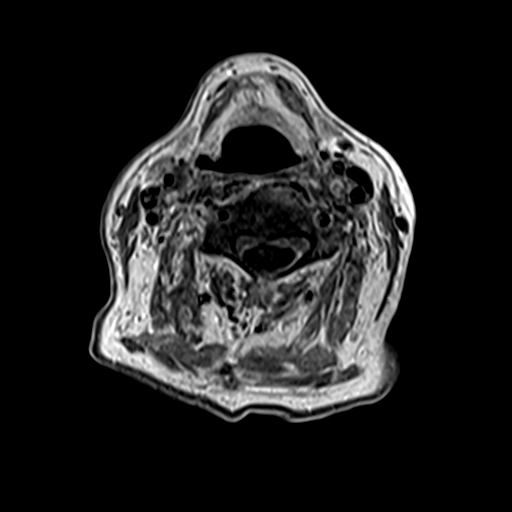
[im 16/35]
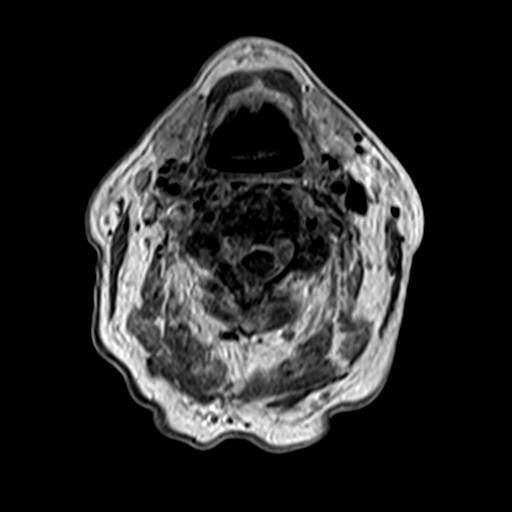
[im 19/35]
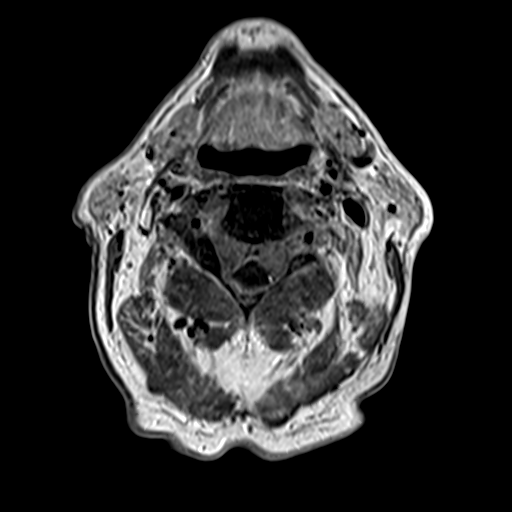
[im 24/35]
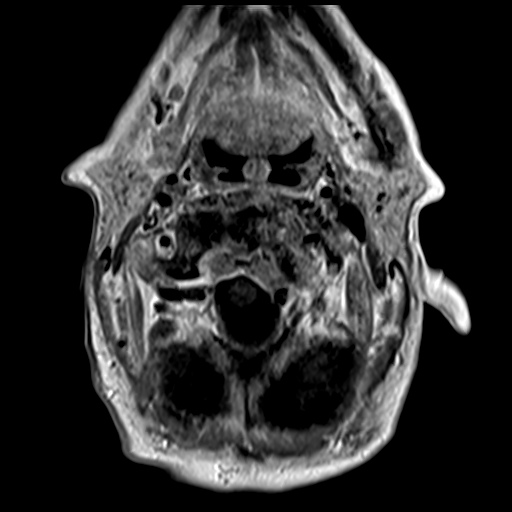
[im 29/35]
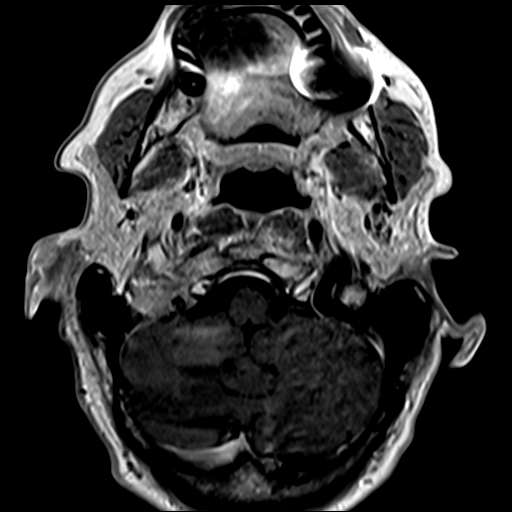

[31 of 48 positions shown; findings below may reference images not displayed]

FINDINGS: MRI CERVICAL SPINE FINDINGS

Alignment: Stable lordosis.

Vertebrae: T1 hypointense metastatic disease throughout the C1, C2,
C3 vertebrae. Vertebral bodies and posterior elements affected at
those levels. Heterogeneous, subtotal involvement of C4 as
demonstrated on the MRI last month. Comparatively mild C5
involvement. Subtotal C6 vertebral involvement may have progressed
since last month. C7 relatively spared.

Cord: Bulky epidural and extraosseous tumor centered at C2.
Extensive epidural tumor results in malignant spinal stenosis with
mild cord compression on series 17, image 15. Bulky epidural tumor
and spinal stenosis continue to the inferior C4 level. And there is
associated mild stenosis and mass effect also at the
cervicomedullary junction.

But despite the mass effect there is lower brainstem or cervical
cord edema identified.

Tumor infiltrates the bilateral C3 and C4 neural foramina, surrounds
the vertebral arteries at those levels.

The prevertebral space is relatively spared.

No abnormal intradural enhancement.

Posterior Fossa, vertebral arteries, paraspinal tissues: No signal
abnormality at the cervicomedullary junction. Negative visible
posterior fossa otherwise.

Disc levels: Stable degenerative disease. Mild degenerative spinal
stenosis at C5-C6 and C6-C7.

MRI THORACIC SPINE FINDINGS

Alignment:  Preserved thoracic kyphosis.

Vertebrae: Widespread osseous metastatic disease. Complete
involvement of the T1 and T2 vertebrae. Moderate involvement of T3
(eccentric to the left). Patchy involvement of T4. Subtotal
involvement of T5. Patchy involvement of T6 (eccentric to the left).
Complete involvement of T7. Subtotal involvement of T8. Near
complete involvement of T9 and T10. Subtotal involvement of T11 and
T12.

Cord: There is mild ventral epidural tumor extension at the T10
level primarily on the left (series 18, image 10). The ventral cord
is a faced there. But no thoracic cord compression or cord signal
abnormality.

No abnormal intradural enhancement or dural thickening. The conus
medullaris is at T12-L1.

Paraspinal and other soft tissues: No other thoracic extraosseous
tumor extension identified.

Disc levels: Intermittent thoracic degeneration, disc disease is
most pronounced at T6-T7 (series 25, image 12) where the ventral
cord is effaced but there is no significant spinal stenosis.

MRI LUMBAR SPINE FINDINGS

Segmentation:  Normal.

Alignment: Preserved lumbar lordosis. There is subtle
anterolisthesis of L4 on L5 which appears degenerative.

Vertebrae: Widespread osseous metastatic disease. Widespread
involvement of L1 and L2 eccentric to the right. Subtotal
involvement of the L3 vertebral body. Widespread involvement of L4.
Subtotal involvement of the L5 vertebra, with posterior element
involvement on the right.

Similar Patchy and confluent bilateral sacral metastatic disease.

Conus medullaris: Extends to the T12-L1. Level No lower spinal cord
or conus signal abnormality. No abnormal intradural enhancement or
dural thickening.

Paraspinal and other soft tissues: Negative visible abdominal
viscera. Diverticulosis of the distal large bowel.

Disc levels:

Superimposed lumbar spine degeneration maximal at L4-L5 where mild
anterolisthesis is associated with moderate disc and posterior
element degeneration resulting in moderate to severe degenerative
spinal stenosis.
IMPRESSION: 1. Diffuse osseous metastatic disease throughout the spine. Multiple
vertebrae completely replaced by tumor. No pathologic fracture
identified.

2. Bulky epidural and extraosseous tumor from the skull base through
C4, epicenter at C2.
Subsequent malignant cervical spinal stenosis, including at the
cervicomedullary junction.
Mild cord compression, maximal at C2, but no spinal cord edema.
Tumor infiltrates the bilateral C3 and C4 neural foramina.

3. Mild ventral epidural tumor at T10 eccentric to the left. No
thoracic cord compression or cord signal abnormality.

4. Superimposed degenerative disease. Moderate to severe
degenerative spinal stenosis at L4-L5 in the setting of grade 1
spondylolisthesis.

## 2021-08-23 IMAGING — MR MR THORACIC SPINE WO/W CM
7 of 9 series · 37 of 48 positions shown · IV contrast (gadavist)
Comparison: Cervical spine MRI [DATE].

CLINICAL DATA: 80-year-old male with prostate cancer. Constant neck
and spine pain. Abnormal cervical spine MRI last month.

EXAM:
MRI TOTAL SPINE WITHOUT AND WITH CONTRAST
TECHNIQUE: Multisequence MR imaging of the spine from the cervical spine to the
sacrum was performed prior to and following IV contrast
administration for evaluation of spinal metastatic disease.
CONTRAST:  6 mL Gadavist

[Series 19: T1 · sagittal · 4.0mm · 1.45mm/px · 6 of 19 slices shown (1 of 3)]
[im 1/19]
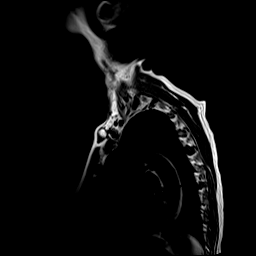
[im 4/19]
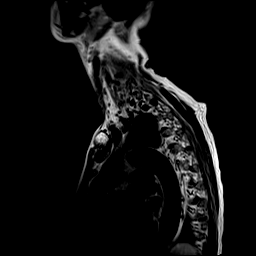
[im 8/19]
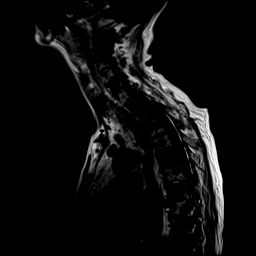
[im 11/19]
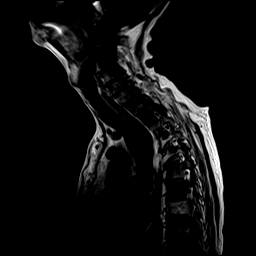
[im 15/19]
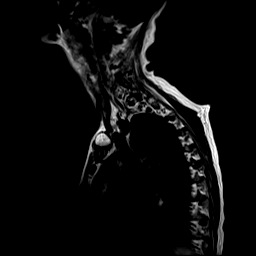
[im 19/19]
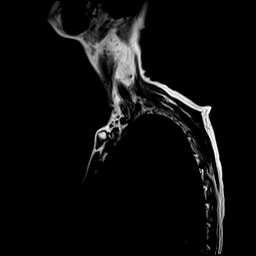

[Series 20: T1 · sagittal · 4.0mm · 1.45mm/px · 6 of 19 slices shown (2 of 3)]
[im 1/19]
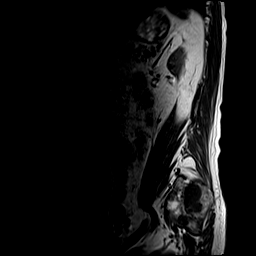
[im 4/19]
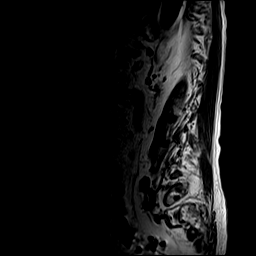
[im 8/19]
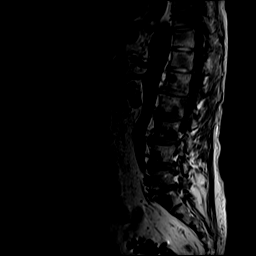
[im 11/19]
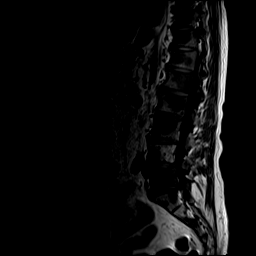
[im 15/19]
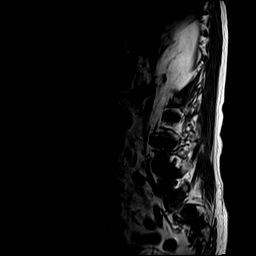
[im 19/19]
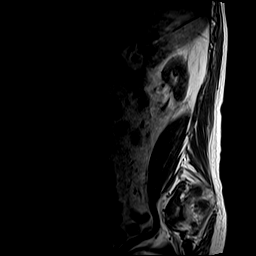

[Series 21: T1 · sagittal · 5.0mm · 1.45mm/px · 6 of 18 slices shown (3 of 3)]
[im 1/18]
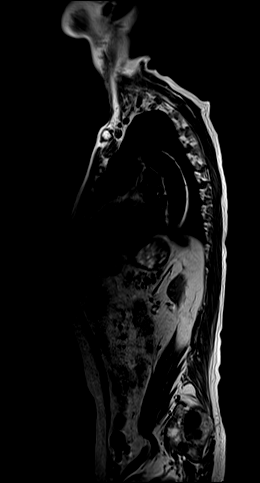
[im 4/18]
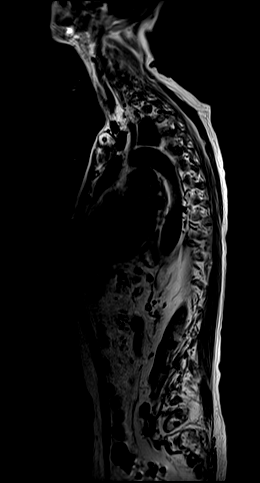
[im 7/18]
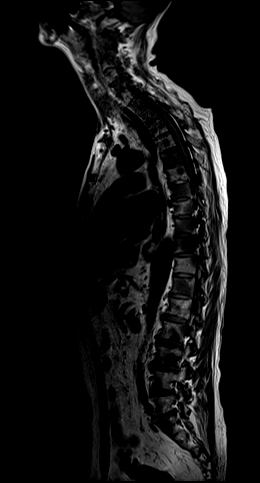
[im 11/18]
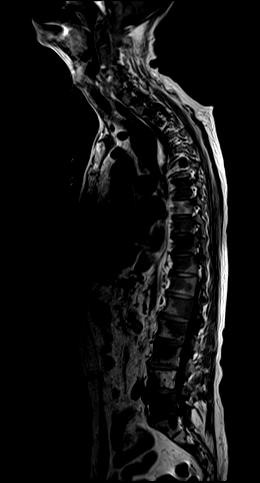
[im 14/18]
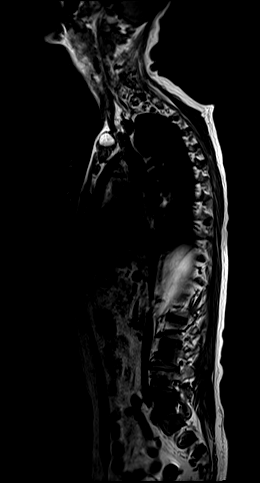
[im 18/18]
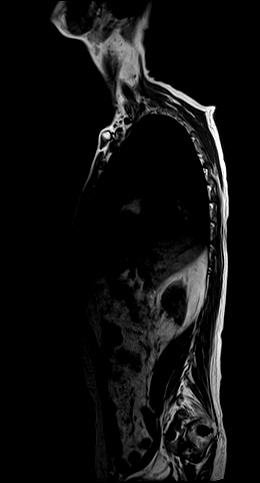

[Series 25: T2 · sagittal · 4.0mm · 1.45mm/px · 5 of 19 slices shown (1 of 3)]
[im 1/19]
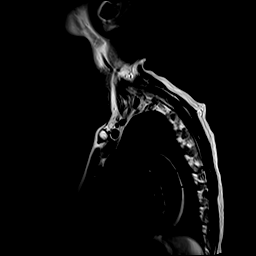
[im 5/19]
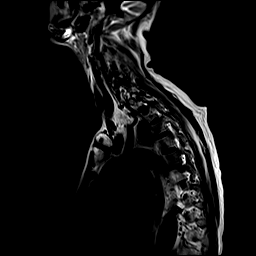
[im 10/19]
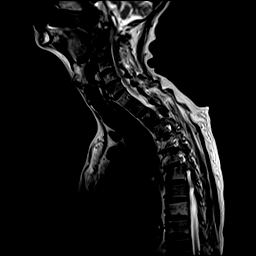
[im 14/19]
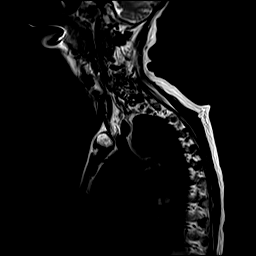
[im 19/19]
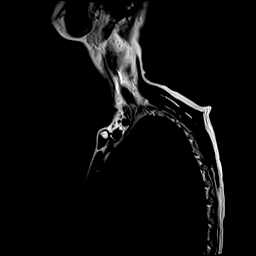

[Series 26: T2 · sagittal · 4.0mm · 1.45mm/px · 5 of 19 slices shown (2 of 3)]
[im 1/19]
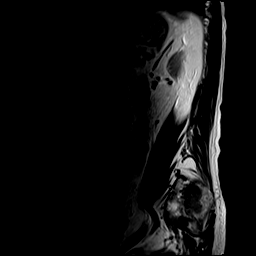
[im 5/19]
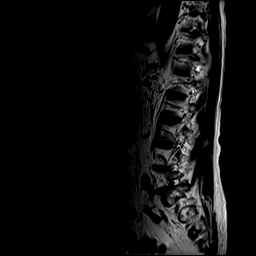
[im 10/19]
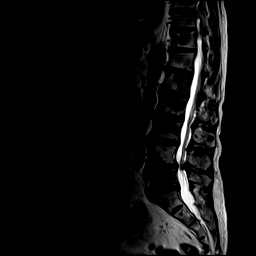
[im 14/19]
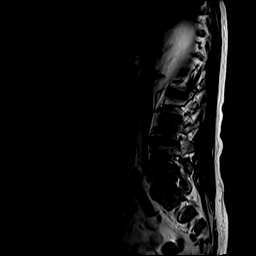
[im 19/19]
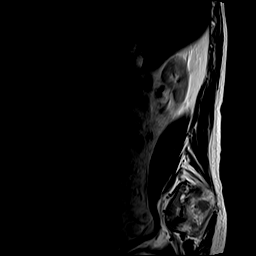

[Series 27: T2 · sagittal · 5.0mm · 1.45mm/px · 5 of 18 slices shown (3 of 3)]
[im 1/18]
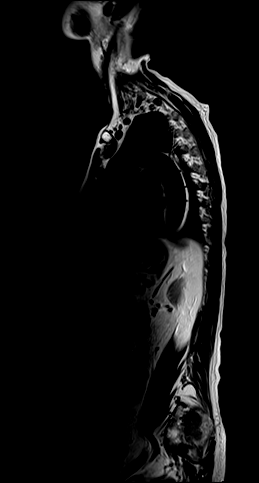
[im 5/18]
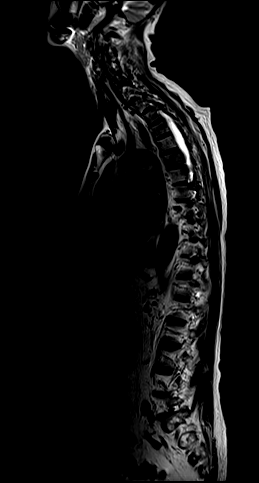
[im 9/18]
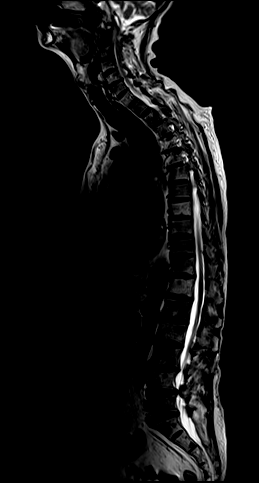
[im 13/18]
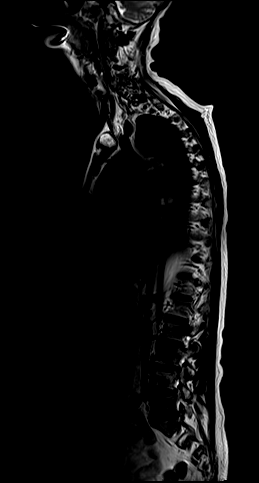
[im 18/18]
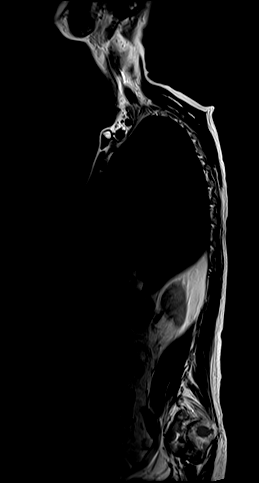

[Series 28: STIR · sagittal · 4.0mm · 0.72mm/px · 4 of 19 slices shown]
[im 1/19]
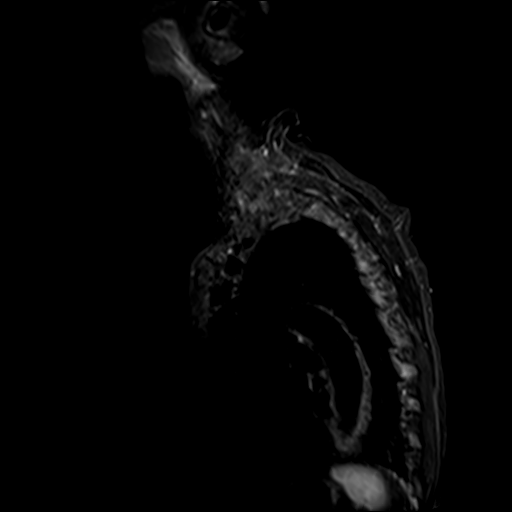
[im 5/19]
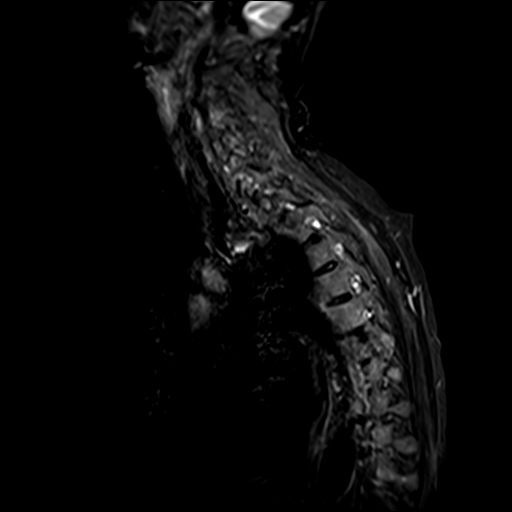
[im 10/19]
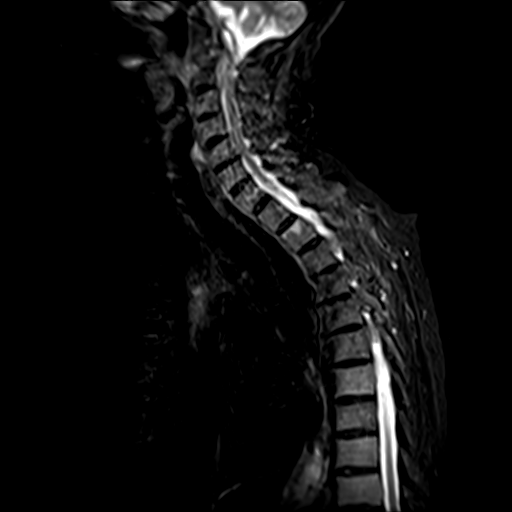
[im 14/19]
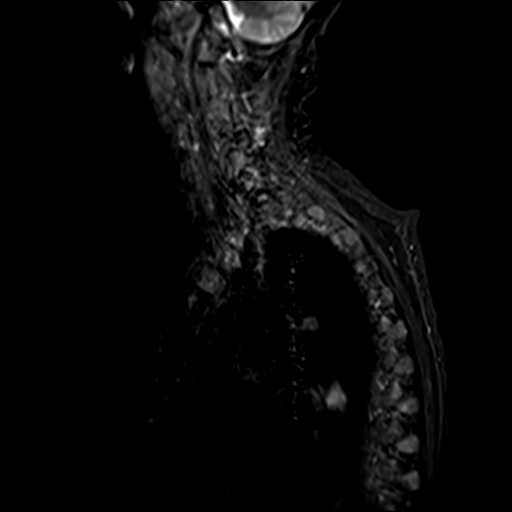

[37 of 48 positions shown; findings below may reference images not displayed]

FINDINGS: MRI CERVICAL SPINE FINDINGS

Alignment: Stable lordosis.

Vertebrae: T1 hypointense metastatic disease throughout the C1, C2,
C3 vertebrae. Vertebral bodies and posterior elements affected at
those levels. Heterogeneous, subtotal involvement of C4 as
demonstrated on the MRI last month. Comparatively mild C5
involvement. Subtotal C6 vertebral involvement may have progressed
since last month. C7 relatively spared.

Cord: Bulky epidural and extraosseous tumor centered at C2.
Extensive epidural tumor results in malignant spinal stenosis with
mild cord compression on series 17, image 15. Bulky epidural tumor
and spinal stenosis continue to the inferior C4 level. And there is
associated mild stenosis and mass effect also at the
cervicomedullary junction.

But despite the mass effect there is lower brainstem or cervical
cord edema identified.

Tumor infiltrates the bilateral C3 and C4 neural foramina, surrounds
the vertebral arteries at those levels.

The prevertebral space is relatively spared.

No abnormal intradural enhancement.

Posterior Fossa, vertebral arteries, paraspinal tissues: No signal
abnormality at the cervicomedullary junction. Negative visible
posterior fossa otherwise.

Disc levels: Stable degenerative disease. Mild degenerative spinal
stenosis at C5-C6 and C6-C7.

MRI THORACIC SPINE FINDINGS

Alignment:  Preserved thoracic kyphosis.

Vertebrae: Widespread osseous metastatic disease. Complete
involvement of the T1 and T2 vertebrae. Moderate involvement of T3
(eccentric to the left). Patchy involvement of T4. Subtotal
involvement of T5. Patchy involvement of T6 (eccentric to the left).
Complete involvement of T7. Subtotal involvement of T8. Near
complete involvement of T9 and T10. Subtotal involvement of T11 and
T12.

Cord: There is mild ventral epidural tumor extension at the T10
level primarily on the left (series 18, image 10). The ventral cord
is a faced there. But no thoracic cord compression or cord signal
abnormality.

No abnormal intradural enhancement or dural thickening. The conus
medullaris is at T12-L1.

Paraspinal and other soft tissues: No other thoracic extraosseous
tumor extension identified.

Disc levels: Intermittent thoracic degeneration, disc disease is
most pronounced at T6-T7 (series 25, image 12) where the ventral
cord is effaced but there is no significant spinal stenosis.

MRI LUMBAR SPINE FINDINGS

Segmentation:  Normal.

Alignment: Preserved lumbar lordosis. There is subtle
anterolisthesis of L4 on L5 which appears degenerative.

Vertebrae: Widespread osseous metastatic disease. Widespread
involvement of L1 and L2 eccentric to the right. Subtotal
involvement of the L3 vertebral body. Widespread involvement of L4.
Subtotal involvement of the L5 vertebra, with posterior element
involvement on the right.

Similar Patchy and confluent bilateral sacral metastatic disease.

Conus medullaris: Extends to the T12-L1. Level No lower spinal cord
or conus signal abnormality. No abnormal intradural enhancement or
dural thickening.

Paraspinal and other soft tissues: Negative visible abdominal
viscera. Diverticulosis of the distal large bowel.

Disc levels:

Superimposed lumbar spine degeneration maximal at L4-L5 where mild
anterolisthesis is associated with moderate disc and posterior
element degeneration resulting in moderate to severe degenerative
spinal stenosis.
IMPRESSION: 1. Diffuse osseous metastatic disease throughout the spine. Multiple
vertebrae completely replaced by tumor. No pathologic fracture
identified.

2. Bulky epidural and extraosseous tumor from the skull base through
C4, epicenter at C2.
Subsequent malignant cervical spinal stenosis, including at the
cervicomedullary junction.
Mild cord compression, maximal at C2, but no spinal cord edema.
Tumor infiltrates the bilateral C3 and C4 neural foramina.

3. Mild ventral epidural tumor at T10 eccentric to the left. No
thoracic cord compression or cord signal abnormality.

4. Superimposed degenerative disease. Moderate to severe
degenerative spinal stenosis at L4-L5 in the setting of grade 1
spondylolisthesis.

## 2021-08-23 IMAGING — MR MR CERVICAL SPINE WO/W CM
1 series · 17 of 35 positions shown · IV contrast (6ml Gadavist)
Comparison: Cervical spine MRI [DATE].

CLINICAL DATA: 80-year-old male with prostate cancer. Constant neck
and spine pain. Abnormal cervical spine MRI last month.

EXAM:
MRI TOTAL SPINE WITHOUT AND WITH CONTRAST
TECHNIQUE: Multisequence MR imaging of the spine from the cervical spine to the
sacrum was performed prior to and following IV contrast
administration for evaluation of spinal metastatic disease.
CONTRAST:  6 mL Gadavist

[Series 17: T1 post-contrast · axial · 3.0mm · 0.35mm/px · z∈[-72,+37]mm · 17 of 35 slices shown]
[im 1/35]
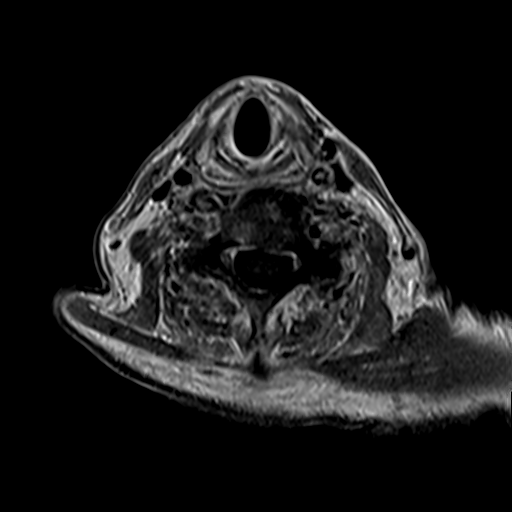
[im 2/35]
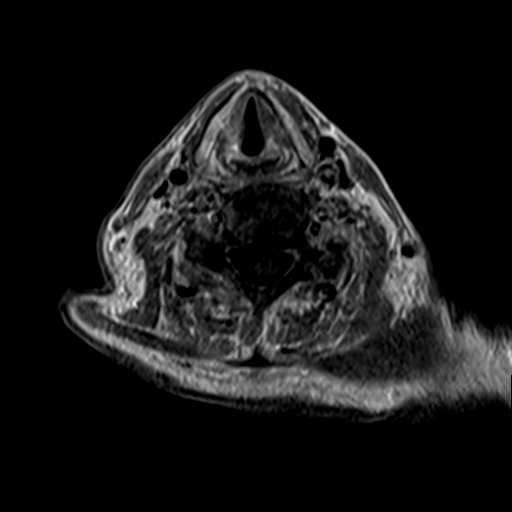
[im 3/35]
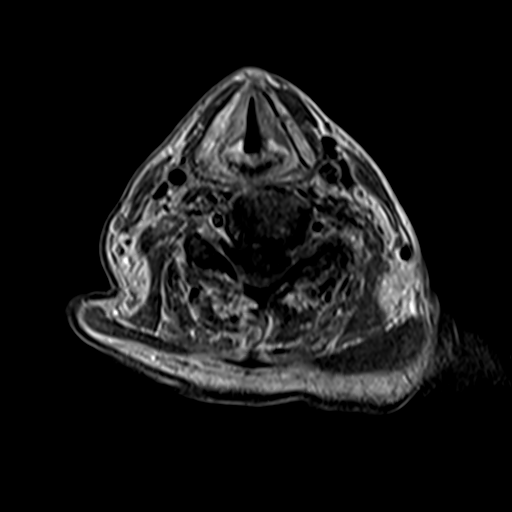
[im 4/35]
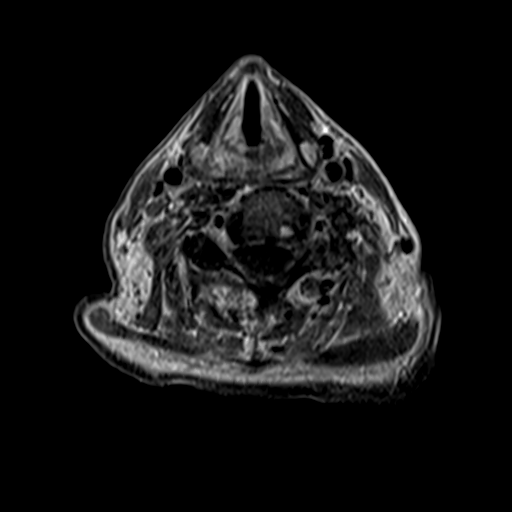
[im 5/35]
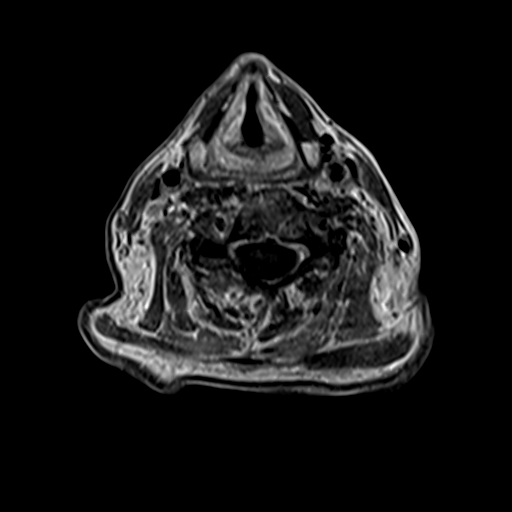
[im 6/35]
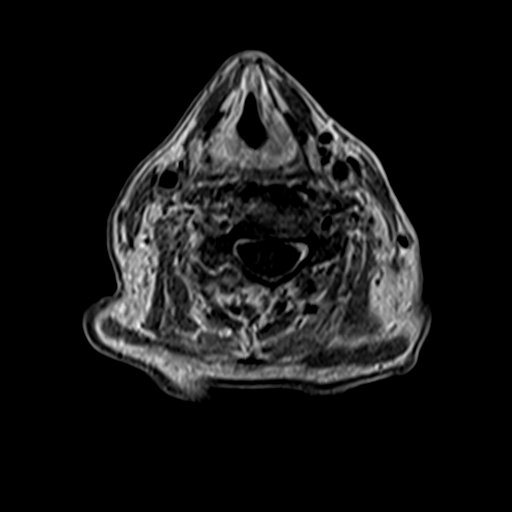
[im 7/35]
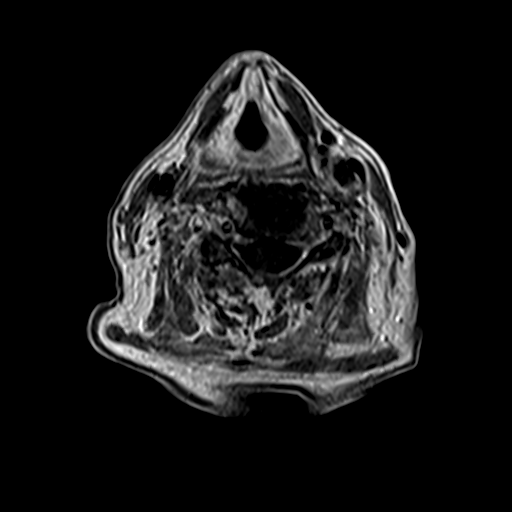
[im 8/35]
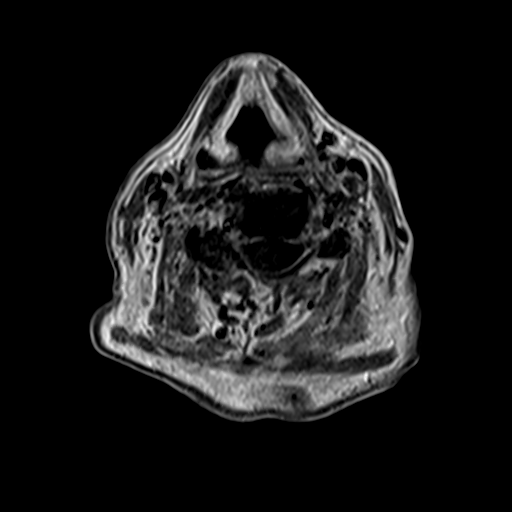
[im 9/35]
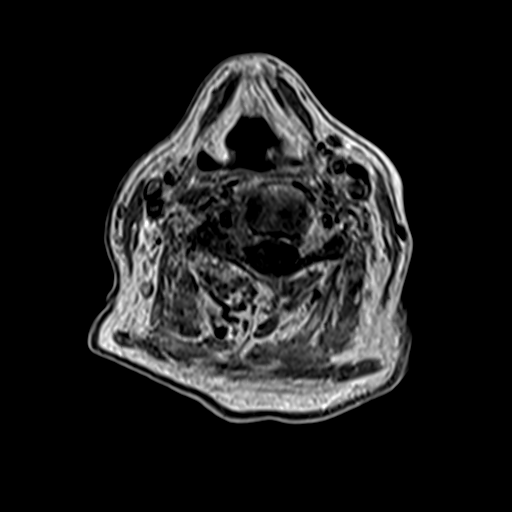
[im 11/35]
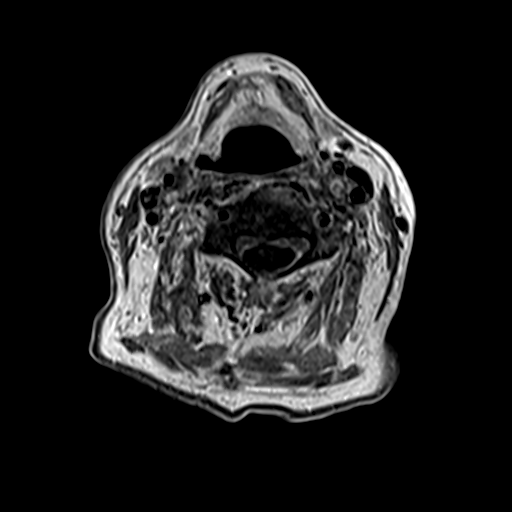
[im 16/35]
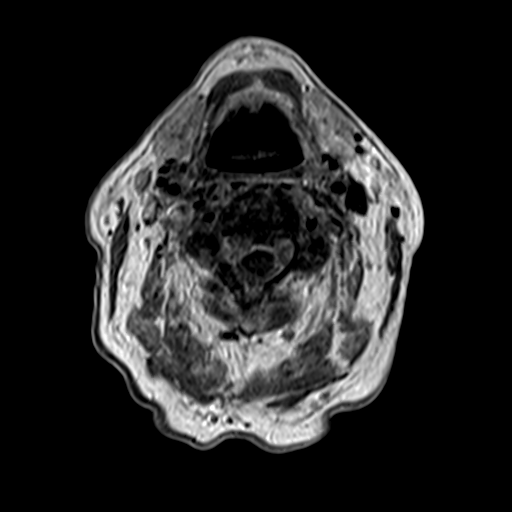
[im 18/35]
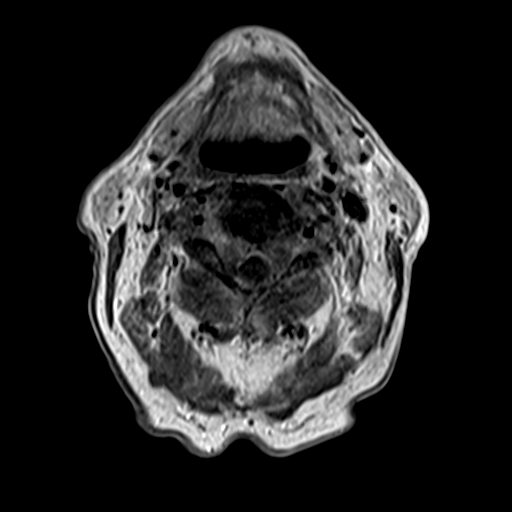
[im 20/35]
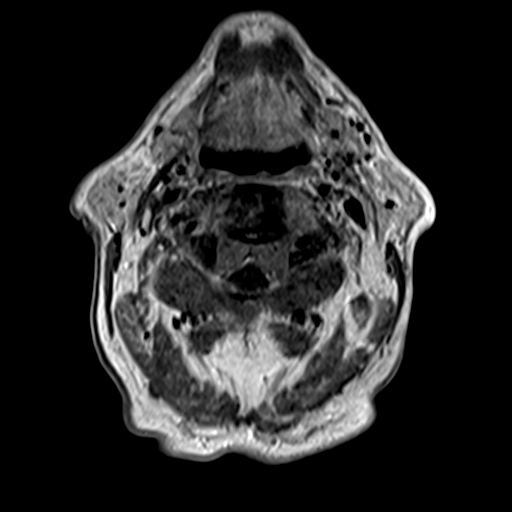
[im 25/35]
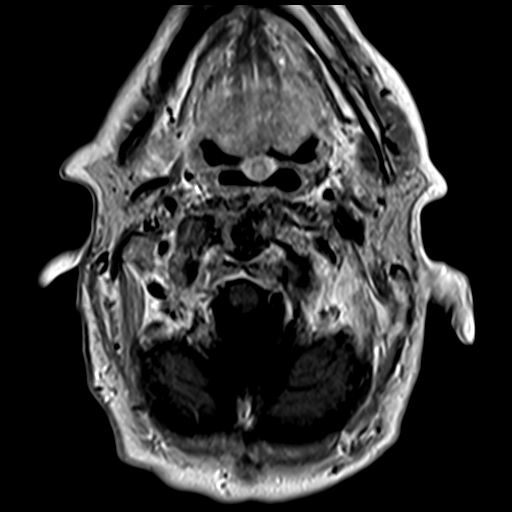
[im 29/35]
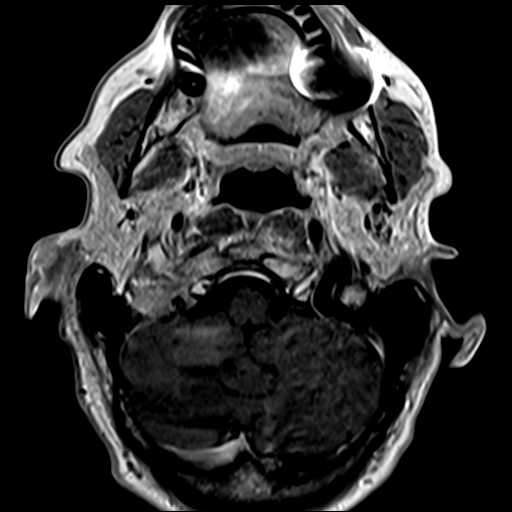
[im 30/35]
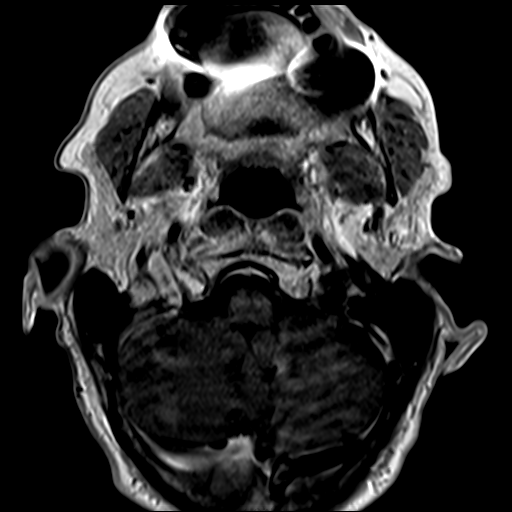
[im 33/35]
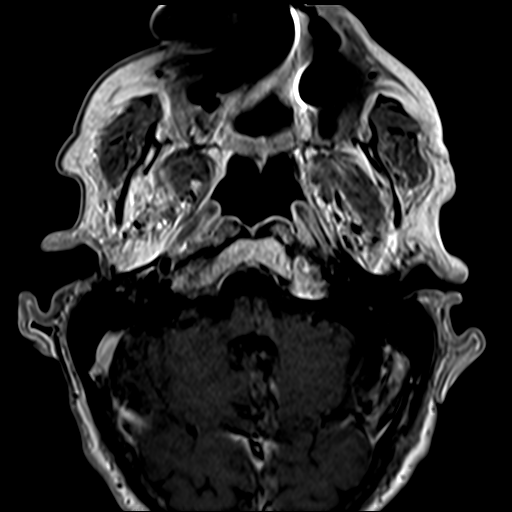

[17 of 35 positions shown; findings below may reference images not displayed]

FINDINGS: MRI CERVICAL SPINE FINDINGS

Alignment: Stable lordosis.

Vertebrae: T1 hypointense metastatic disease throughout the C1, C2,
C3 vertebrae. Vertebral bodies and posterior elements affected at
those levels. Heterogeneous, subtotal involvement of C4 as
demonstrated on the MRI last month. Comparatively mild C5
involvement. Subtotal C6 vertebral involvement may have progressed
since last month. C7 relatively spared.

Cord: Bulky epidural and extraosseous tumor centered at C2.
Extensive epidural tumor results in malignant spinal stenosis with
mild cord compression on series 17, image 15. Bulky epidural tumor
and spinal stenosis continue to the inferior C4 level. And there is
associated mild stenosis and mass effect also at the
cervicomedullary junction.

But despite the mass effect there is lower brainstem or cervical
cord edema identified.

Tumor infiltrates the bilateral C3 and C4 neural foramina, surrounds
the vertebral arteries at those levels.

The prevertebral space is relatively spared.

No abnormal intradural enhancement.

Posterior Fossa, vertebral arteries, paraspinal tissues: No signal
abnormality at the cervicomedullary junction. Negative visible
posterior fossa otherwise.

Disc levels: Stable degenerative disease. Mild degenerative spinal
stenosis at C5-C6 and C6-C7.

MRI THORACIC SPINE FINDINGS

Alignment:  Preserved thoracic kyphosis.

Vertebrae: Widespread osseous metastatic disease. Complete
involvement of the T1 and T2 vertebrae. Moderate involvement of T3
(eccentric to the left). Patchy involvement of T4. Subtotal
involvement of T5. Patchy involvement of T6 (eccentric to the left).
Complete involvement of T7. Subtotal involvement of T8. Near
complete involvement of T9 and T10. Subtotal involvement of T11 and
T12.

Cord: There is mild ventral epidural tumor extension at the T10
level primarily on the left (series 18, image 10). The ventral cord
is a faced there. But no thoracic cord compression or cord signal
abnormality.

No abnormal intradural enhancement or dural thickening. The conus
medullaris is at T12-L1.

Paraspinal and other soft tissues: No other thoracic extraosseous
tumor extension identified.

Disc levels: Intermittent thoracic degeneration, disc disease is
most pronounced at T6-T7 (series 25, image 12) where the ventral
cord is effaced but there is no significant spinal stenosis.

MRI LUMBAR SPINE FINDINGS

Segmentation:  Normal.

Alignment: Preserved lumbar lordosis. There is subtle
anterolisthesis of L4 on L5 which appears degenerative.

Vertebrae: Widespread osseous metastatic disease. Widespread
involvement of L1 and L2 eccentric to the right. Subtotal
involvement of the L3 vertebral body. Widespread involvement of L4.
Subtotal involvement of the L5 vertebra, with posterior element
involvement on the right.

Similar Patchy and confluent bilateral sacral metastatic disease.

Conus medullaris: Extends to the T12-L1. Level No lower spinal cord
or conus signal abnormality. No abnormal intradural enhancement or
dural thickening.

Paraspinal and other soft tissues: Negative visible abdominal
viscera. Diverticulosis of the distal large bowel.

Disc levels:

Superimposed lumbar spine degeneration maximal at L4-L5 where mild
anterolisthesis is associated with moderate disc and posterior
element degeneration resulting in moderate to severe degenerative
spinal stenosis.
IMPRESSION: 1. Diffuse osseous metastatic disease throughout the spine. Multiple
vertebrae completely replaced by tumor. No pathologic fracture
identified.

2. Bulky epidural and extraosseous tumor from the skull base through
C4, epicenter at C2.
Subsequent malignant cervical spinal stenosis, including at the
cervicomedullary junction.
Mild cord compression, maximal at C2, but no spinal cord edema.
Tumor infiltrates the bilateral C3 and C4 neural foramina.

3. Mild ventral epidural tumor at T10 eccentric to the left. No
thoracic cord compression or cord signal abnormality.

4. Superimposed degenerative disease. Moderate to severe
degenerative spinal stenosis at L4-L5 in the setting of grade 1
spondylolisthesis.

## 2021-08-23 MED ORDER — GADOBUTROL 1 MMOL/ML IV SOLN
6.0000 mL | Freq: Once | INTRAVENOUS | Status: AC | PRN
  Administered 2021-08-23: 6 mL via INTRAVENOUS

## 2021-08-26 ENCOUNTER — Inpatient Hospital Stay: Payer: Medicare Other | Attending: Oncology | Admitting: Oncology

## 2021-08-26 ENCOUNTER — Encounter: Payer: Self-pay | Admitting: Oncology

## 2021-08-26 ENCOUNTER — Inpatient Hospital Stay: Payer: Medicare Other

## 2021-08-26 ENCOUNTER — Other Ambulatory Visit: Payer: Self-pay

## 2021-08-26 VITALS — BP 130/58 | HR 85 | Temp 97.8°F | Resp 20 | Wt 148.3 lb

## 2021-08-26 DIAGNOSIS — D508 Other iron deficiency anemias: Secondary | ICD-10-CM

## 2021-08-26 DIAGNOSIS — R634 Abnormal weight loss: Secondary | ICD-10-CM | POA: Diagnosis not present

## 2021-08-26 DIAGNOSIS — C7951 Secondary malignant neoplasm of bone: Secondary | ICD-10-CM | POA: Diagnosis not present

## 2021-08-26 DIAGNOSIS — R1319 Other dysphagia: Secondary | ICD-10-CM | POA: Diagnosis not present

## 2021-08-26 DIAGNOSIS — D509 Iron deficiency anemia, unspecified: Secondary | ICD-10-CM | POA: Insufficient documentation

## 2021-08-26 DIAGNOSIS — C61 Malignant neoplasm of prostate: Secondary | ICD-10-CM | POA: Diagnosis not present

## 2021-08-26 DIAGNOSIS — M899 Disorder of bone, unspecified: Secondary | ICD-10-CM | POA: Insufficient documentation

## 2021-08-26 DIAGNOSIS — M542 Cervicalgia: Secondary | ICD-10-CM

## 2021-08-26 NOTE — Progress Notes (Signed)
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   HISTORY OF PRESENTING ILLNESS:   Stuart Mcneil is a  80 y.o.  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   He reports having neck pain, and bilateral shoulder pain, posterior headache. He has RA and was previously on MTX and Enbrel. Recently off MTX due to elevated LFT. He takes Hydrocodone twice daily for pain.  The pain is as bad as 9 out of 10.  With pain medication, pain level reduces to 3 out of 10.  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 Given reported sclerosis on prior radiographs, consider metastatic prostate cancer. Correlate with serum PSA levels.2. There is significant associated anterior epidural tumor from C2 through C4 with associated mass effect on the thecal sac and biforaminal extension of tumor at C2-3 and C3-4. These findings place the patient at risk for cord compression which is not definitely demonstrated at this time. Neurosurgical evaluation recommended. Relatively mild spondylosis, greatest at C5-6 and C6-7. Patient was referred to see neurosurgery Dr. 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  Multiple vertebral completely replaced by tumor.  No pathological fracture.  Bulky epidural and extraosseous tumor from skull base through C4, epicenter at C2.  Subsequent malignant cervical spine stenosis, including at cervical medullary junction.  Mild cord compression maximal at C2, no spinal cord edema.  Tumor  infiltrates the bilateral C3 and C4 neural foraminal.  Mild ventral epidural tumor at T10 eccentric to the left.  No thoracic cord compression or cord signal abnormality.  Superimposed degenerative disease.  Patient denies any focal weakness.  He has decreased appetite. He endorses weight loss since earlier this year.  He has a sensation of food stuck in the middle of the chest.  Patient has a history of Barrett esophagus. No recent EGD or colonoscopy.  Denies any family history of cancer.  Denies night sweat or fever. Patient lives at home by himself.  His wife lives in facility.  He was accompanied by his son Gerald Stabs who lives 90 minutes drive away.  Review of Systems  Constitutional:  Positive for appetite change, fatigue and unexpected weight change. Negative for chills and fever.  HENT:   Negative for hearing loss and voice change.   Eyes:  Negative for eye problems and icterus.  Respiratory:  Negative for chest tightness, cough and shortness of breath.   Cardiovascular:  Negative for chest pain and leg swelling.  Gastrointestinal:  Negative for abdominal distention and abdominal pain.       Food stuck in the middle of chest  Endocrine: Negative for hot flashes.  Genitourinary:  Negative for difficulty urinating, dysuria and frequency.   Musculoskeletal:  Positive for neck pain. Negative for arthralgias.       Shoulder pain  Skin:  Negative for itching and rash.  Neurological:  Negative for light-headedness and numbness.  Hematological:  Negative for adenopathy. Does not bruise/bleed easily.  Psychiatric/Behavioral:  Negative for confusion.    MEDICAL HISTORY:  Past Medical History:  Diagnosis Date   Adenomatous colon  polyp    AR (allergic rhinitis)    BPH (benign prostatic hyperplasia)    ED (erectile dysfunction)    HLD (hyperlipidemia)    Prostatitis    RA (rheumatoid arthritis) ([place])     SURGICAL HISTORY: Past Surgical History:  Procedure Laterality Date   none       SOCIAL HISTORY: Social History   Socioeconomic History   Marital status: Married    Spouse name: Not on file   Number of children: Not on file   Years of education: Not on file   Highest education level: Not on file  Occupational History   Not on file  Tobacco Use   Smoking status: Former   Smokeless tobacco: Never   Tobacco comments:    quit 30 years  Substance and Sexual Activity   Alcohol use: No    Alcohol/week: 0.0 standard drinks   Drug use: No   Sexual activity: Yes    Birth control/protection: None  Other Topics Concern   Not on file  Social History Narrative   Not on file   Social Determinants of Health   Financial Resource Strain: Not on file  Food Insecurity: Not on file  Transportation Needs: Not on file  Physical Activity: Not on file  Stress: Not on file  Social Connections: Not on file  Intimate Partner Violence: Not on file    FAMILY HISTORY: Family History  Problem Relation Age of Onset   Diabetes Mellitus II Father    Diabetes Mellitus II Sister    Kidney disease Neg Hx    Prostate cancer Neg Hx    Bladder Cancer Neg Hx     ALLERGIES:  has No Known Allergies.  MEDICATIONS:  Current Outpatient Medications  Medication Sig Dispense Refill   etanercept (ENBREL) 50 MG/ML injection      etodolac (LODINE) 400 MG tablet Reported on 7/61/6073     folic acid (FOLVITE) 1 MG tablet      Glucosamine-Chondroitin 500-400 MG CAPS Take by mouth.     HYDROcodone-acetaminophen (NORCO/VICODIN) 5-325 MG tablet Take 1 tablet by mouth 2 (two) times daily.     Multiple Vitamins-Minerals (MULTIVITAMIN ADULT PO) Take by mouth daily.     naproxen sodium (ALEVE) 220 MG tablet Take by mouth.     omeprazole (PRILOSEC) 20 MG capsule Take by mouth.     omeprazole (PRILOSEC) 20 MG capsule Take by mouth.     sildenafil (VIAGRA) 100 MG tablet Take 1 tablet (100 mg total) by mouth daily as needed for erectile dysfunction. 30 tablet 3   tadalafil (CIALIS) 5 MG tablet  Take one tablet daily for benign prostatic hyperplasia 90 tablet 3   methotrexate (RHEUMATREX) 2.5 MG tablet  (Patient not taking: Reported on 08/26/2021)     mirabegron ER (MYRBETRIQ) 50 MG TB24 tablet Take 1 tablet (50 mg total) by mouth daily. (Patient not taking: Reported on 08/26/2021) 90 tablet 3   tiZANidine (ZANAFLEX) 2 MG tablet Take 2 mg by mouth 3 (three) times daily. (Patient not taking: Reported on 08/26/2021)     No current facility-administered medications for this visit.     PHYSICAL EXAMINATION: ECOG PERFORMANCE STATUS: 1 - Symptomatic but completely ambulatory Vitals:   08/26/21 0933  BP: (!) 130/58  Pulse: 85  Resp: 20  Temp: 97.8 F (36.6 C)  SpO2: 100%   Filed Weights   08/26/21 0933  Weight: 148 lb 4.8 oz (67.3 kg)    Physical Exam  LABORATORY DATA:  I have  reviewed the data as listed No results found for: WBC, HGB, HCT, MCV, PLT No results for input(s): NA, K, CL, CO2, GLUCOSE, BUN, CREATININE, CALCIUM, GFRNONAA, GFRAA, PROT, ALBUMIN, AST, ALT, ALKPHOS, BILITOT, BILIDIR, IBILI in the last 8760 hours. Iron/TIBC/Ferritin/ %Sat No results found for: IRON, TIBC, FERRITIN, IRONPCTSAT    RADIOGRAPHIC STUDIES: I have personally reviewed the radiological images as listed and agreed with the findings in the report. MR CERVICAL SPINE WO CONTRAST  Result Date: 08/14/2021 CLINICAL DATA:  Neck pain radiating into both arms for 1 month. No known injury. History of arthritis. Reported C2 sclerosis on outside radiographs; no given history of malignancy. EXAM: MRI CERVICAL SPINE WITHOUT CONTRAST TECHNIQUE: Multiplanar, multisequence MR imaging of the cervical spine was performed. No intravenous contrast was administered. COMPARISON:  Report only from outside radiographs 08/04/2021. Abdominopelvic CT 06/13/2012. FINDINGS: Alignment: Physiologic. Vertebrae: There is diffuse replacement of the normal marrow signal within the C2 vertebral body and its posterior elements,  best seen on the T1 weighted images. There is similar diffuse replacement of the normal marrow signal within the C3 vertebral body, and other smaller lesions are present throughout the cervical and upper thoracic spine. Findings are consistent with diffuse metastatic disease. There is associated significant anterior epidural tumor anteriorly from C2 through C4. No significant pathologic fracture identified. Cord: The CSF surrounding the cord is effaced by the epidural tumor at C2. No definite cord compression or abnormal cord signal. Posterior Fossa, vertebral arteries, paraspinal tissues: Visualized portions of the posterior fossa appear unremarkable. No significant paraspinal findings. Bilateral vertebral artery flow voids. Disc levels: C2-3: The disc appears normal. As above, there is significant anterior epidural tumor bilaterally related to the extensive metastatic disease involving the C2 and C3 vertebral bodies. There is effacement of the CSF surrounding the cord as well as foraminal extension of epidural tumor bilaterally. C3-4: The disc appears normal. There is lesser anterior epidural tumor at this level with biforaminal extension of tumor. No cord deformity. C4-5: Mild disc bulging and uncinate spurring asymmetric to the right. Mild right foraminal narrowing. No cord deformity. C5-6: Spondylosis with loss of disc height, uncinate spurring and bilateral facet hypertrophy. Resulting mild spinal stenosis with left greater than right foraminal narrowing. C6-7: Mild disc bulging and facet hypertrophy. No significant spinal stenosis or nerve root encroachment. C7-T1: Mild bilateral facet hypertrophy. No significant spinal stenosis or nerve root encroachment. IMPRESSION: 1. 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 Given reported sclerosis on prior radiographs, consider metastatic prostate cancer. Correlate with serum PSA levels. 2. There is significant associated anterior  epidural tumor from C2 through C4 with associated mass effect on the thecal sac and biforaminal extension of tumor at C2-3 and C3-4. These findings place the patient at risk for cord compression which is not definitely demonstrated at this time. Neurosurgical evaluation recommended. 3. Relatively mild spondylosis, greatest at C5-6 and C6-7. 4. These results will be called to the ordering clinician or representative by the Radiologist Assistant, and communication documented in the PACS or [employer]. Electronically Signed   By: Richardean Sale M.D.   On: 08/14/2021 09:51   MR CERVICAL SPINE W WO CONTRAST  Result Date: 08/23/2021 CLINICAL DATA:  80 year old male with prostate cancer. Constant neck and spine pain. Abnormal cervical spine MRI last month. EXAM: MRI TOTAL SPINE WITHOUT AND WITH CONTRAST TECHNIQUE: Multisequence MR imaging of the spine from the cervical spine to the sacrum was performed prior to and following IV contrast administration for evaluation of  spinal metastatic disease. CONTRAST:  6 mL Gadavist COMPARISON:  Cervical spine MRI 08/12/2021. FINDINGS: MRI CERVICAL SPINE FINDINGS Alignment: Stable lordosis. Vertebrae: T1 hypointense metastatic disease throughout the C1, C2, C3 vertebrae. Vertebral bodies and posterior elements affected at those levels. Heterogeneous, subtotal involvement of C4 as demonstrated on the MRI last month. Comparatively mild C5 involvement. Subtotal C6 vertebral involvement may have progressed since last month. C7 relatively spared. 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 Bulky epidural tumor and spinal stenosis continue to the inferior C4 level. And there is associated mild stenosis and mass effect also at the cervicomedullary junction. But despite the mass effect there is lower brainstem or cervical cord edema identified. Tumor  infiltrates the bilateral C3 and C4 neural foramina, surrounds the vertebral arteries at those levels. The prevertebral space is relatively spared. No abnormal intradural enhancement. Posterior Fossa, vertebral arteries, paraspinal tissues: No signal abnormality at the cervicomedullary junction. Negative visible posterior fossa otherwise. Disc levels: Stable degenerative disease. Mild degenerative spinal stenosis at C5-C6 and C6-C7. MRI THORACIC SPINE FINDINGS Alignment:  Preserved thoracic kyphosis. Vertebrae: Widespread osseous metastatic disease. Complete involvement of the T1 and T2 vertebrae. Moderate involvement of T3 (eccentric to the left). Patchy involvement of T4. Subtotal involvement of T5. Patchy involvement of T6 (eccentric to the left). Complete involvement of T7. Subtotal involvement of T8. Near complete involvement of T9 and T10. Subtotal involvement of T11 and T12. Cord: There is mild ventral epidural tumor extension at the T10 level primarily on the left (series 18, image 10). The ventral cord is a faced there. But no thoracic cord compression or cord signal abnormality. No abnormal intradural enhancement or dural thickening. The conus medullaris is at T12-L1. Paraspinal and other soft tissues: No other thoracic extraosseous tumor extension identified. Disc levels: Intermittent thoracic degeneration, disc disease is most pronounced at T6-T7 (series 25, image 12) where the ventral cord is effaced but there is no significant spinal stenosis. MRI LUMBAR SPINE FINDINGS Segmentation:  Normal. Alignment: Preserved lumbar lordosis. There is subtle anterolisthesis of L4 on L5 which appears degenerative. Vertebrae: Widespread osseous metastatic disease. Widespread involvement of L1 and L2 eccentric to the right. Subtotal involvement of the L3 vertebral body. Widespread involvement of L4. Subtotal involvement of the L5 vertebra, with posterior element involvement on the right. Similar Patchy and confluent  bilateral sacral metastatic disease. Conus medullaris: Extends to the T12-L1. Level No lower spinal cord or conus signal abnormality. No abnormal intradural enhancement or dural thickening. Paraspinal and other soft tissues: Negative visible abdominal viscera. Diverticulosis of the distal large bowel. Disc levels: Superimposed lumbar spine degeneration maximal at L4-L5 where mild anterolisthesis is associated with moderate disc and posterior element degeneration resulting in moderate to severe degenerative spinal stenosis. IMPRESSION: 1. 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 Multiple vertebrae completely replaced by tumor. No pathologic fracture identified. 2. 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 Subsequent malignant cervical spinal stenosis, including at the cervicomedullary junction. Mild cord compression, maximal at C2, but no spinal cord edema. Tumor infiltrates the bilateral C3 and C4 neural foramina. 3. Mild ventral epidural tumor at T10 eccentric to the left. No thoracic cord compression or cord signal abnormality. 4. Superimposed degenerative disease. 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 Electronically Signed   By: Herminio Heads.D.  On: 08/23/2021 11:32   MR THORACIC SPINE W WO CONTRAST  Result Date: 08/23/2021 CLINICAL DATA:  81 year old male with prostate cancer. Constant neck and spine pain. Abnormal cervical spine MRI last month. EXAM: MRI TOTAL SPINE WITHOUT AND WITH CONTRAST TECHNIQUE: Multisequence MR imaging of the spine from the cervical spine to the sacrum was performed prior to and following IV contrast administration for evaluation of spinal metastatic disease. CONTRAST:  6 mL Gadavist COMPARISON:  Cervical spine MRI 08/12/2021. FINDINGS: MRI CERVICAL SPINE FINDINGS Alignment: Stable lordosis. Vertebrae: T1 hypointense metastatic disease throughout the C1, C2, C3  vertebrae. Vertebral bodies and posterior elements affected at those levels. Heterogeneous, subtotal involvement of C4 as demonstrated on the MRI last month. Comparatively mild C5 involvement. Subtotal C6 vertebral involvement may have progressed since last month. C7 relatively spared. 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 Bulky epidural tumor and spinal stenosis continue to the inferior C4 level. And there is associated mild stenosis and mass effect also at the cervicomedullary junction. But despite the mass effect there is lower brainstem or cervical cord edema identified. Tumor infiltrates the bilateral C3 and C4 neural foramina, surrounds the vertebral arteries at those levels. The prevertebral space is relatively spared. No abnormal intradural enhancement. Posterior Fossa, vertebral arteries, paraspinal tissues: No signal abnormality at the cervicomedullary junction. Negative visible posterior fossa otherwise. Disc levels: Stable degenerative disease. Mild degenerative spinal stenosis at C5-C6 and C6-C7. MRI THORACIC SPINE FINDINGS Alignment:  Preserved thoracic kyphosis. Vertebrae: Widespread osseous metastatic disease. Complete involvement of the T1 and T2 vertebrae. Moderate involvement of T3 (eccentric to the left). Patchy involvement of T4. Subtotal involvement of T5. Patchy involvement of T6 (eccentric to the left). Complete involvement of T7. Subtotal involvement of T8. Near complete involvement of T9 and T10. Subtotal involvement of T11 and T12. Cord: There is mild ventral epidural tumor extension at the T10 level primarily on the left (series 18, image 10). The ventral cord is a faced there. But no thoracic cord compression or cord signal abnormality. No abnormal intradural enhancement or dural thickening. The conus medullaris is at T12-L1. Paraspinal and other soft  tissues: No other thoracic extraosseous tumor extension identified. Disc levels: Intermittent thoracic degeneration, disc disease is most pronounced at T6-T7 (series 25, image 12) where the ventral cord is effaced but there is no significant spinal stenosis. MRI LUMBAR SPINE FINDINGS Segmentation:  Normal. Alignment: Preserved lumbar lordosis. There is subtle anterolisthesis of L4 on L5 which appears degenerative. Vertebrae: Widespread osseous metastatic disease. Widespread involvement of L1 and L2 eccentric to the right. Subtotal involvement of the L3 vertebral body. Widespread involvement of L4. Subtotal involvement of the L5 vertebra, with posterior element involvement on the right. Similar Patchy and confluent bilateral sacral metastatic disease. Conus medullaris: Extends to the T12-L1. Level No lower spinal cord or conus signal abnormality. No abnormal intradural enhancement or dural thickening. Paraspinal and other soft tissues: Negative visible abdominal viscera. Diverticulosis of the distal large bowel. Disc levels: Superimposed lumbar spine degeneration maximal at L4-L5 where mild anterolisthesis is associated with moderate disc and posterior element degeneration resulting in moderate to severe degenerative spinal stenosis. IMPRESSION: 1. 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 Multiple vertebrae completely replaced by tumor. No pathologic fracture identified. 2. 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 Subsequent malignant cervical spinal stenosis, including at the cervicomedullary junction.  Mild cord compression, maximal at C2, but no spinal cord edema. Tumor infiltrates the bilateral C3 and C4 neural foramina. 3. Mild ventral epidural tumor at T10 eccentric to the left. No thoracic cord compression or cord signal abnormality. 4. Superimposed degenerative disease. 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 Electronically Signed   By: Genevie Ann M.D.   On: 08/23/2021 11:32   MR Lumbar Spine W Wo Contrast  Result Date: 08/23/2021 CLINICAL DATA:  80 year old male with prostate cancer. Constant neck and spine pain. Abnormal cervical spine MRI last month. EXAM: MRI TOTAL SPINE WITHOUT AND WITH CONTRAST TECHNIQUE: Multisequence MR imaging of the spine from the cervical spine to the sacrum was performed prior to and following IV contrast administration for evaluation of spinal metastatic disease. CONTRAST:  6 mL Gadavist COMPARISON:  Cervical spine MRI 08/12/2021. FINDINGS: MRI CERVICAL SPINE FINDINGS Alignment: Stable lordosis. Vertebrae: T1 hypointense metastatic disease throughout the C1, C2, C3 vertebrae. Vertebral bodies and posterior elements affected at those levels. Heterogeneous, subtotal involvement of C4 as demonstrated on the MRI last month. Comparatively mild C5 involvement. Subtotal C6 vertebral involvement may have progressed since last month. C7 relatively spared. 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 Bulky epidural tumor and spinal stenosis continue to the inferior C4 level. And there is associated mild stenosis and mass effect also at the cervicomedullary junction. But despite the mass effect there is lower brainstem or cervical cord edema identified. Tumor infiltrates the bilateral C3 and C4 neural foramina, surrounds the vertebral arteries at those levels. The prevertebral space is relatively spared. No abnormal intradural enhancement. Posterior Fossa, vertebral arteries, paraspinal tissues: No signal abnormality at the cervicomedullary junction. Negative visible posterior fossa otherwise. Disc levels: Stable degenerative disease. Mild degenerative spinal stenosis at C5-C6 and C6-C7. MRI THORACIC SPINE FINDINGS Alignment:  Preserved thoracic kyphosis.  Vertebrae: Widespread osseous metastatic disease. Complete involvement of the T1 and T2 vertebrae. Moderate involvement of T3 (eccentric to the left). Patchy involvement of T4. Subtotal involvement of T5. Patchy involvement of T6 (eccentric to the left). Complete involvement of T7. Subtotal involvement of T8. Near complete involvement of T9 and T10. Subtotal involvement of T11 and T12. Cord: There is mild ventral epidural tumor extension at the T10 level primarily on the left (series 18, image 10). The ventral cord is a faced there. But no thoracic cord compression or cord signal abnormality. No abnormal intradural enhancement or dural thickening. The conus medullaris is at T12-L1. Paraspinal and other soft tissues: No other thoracic extraosseous tumor extension identified. Disc levels: Intermittent thoracic degeneration, disc disease is most pronounced at T6-T7 (series 25, image 12) where the ventral cord is effaced but there is no significant spinal stenosis. MRI LUMBAR SPINE FINDINGS Segmentation:  Normal. Alignment: Preserved lumbar lordosis. There is subtle anterolisthesis of L4 on L5 which appears degenerative. Vertebrae: Widespread osseous metastatic disease. Widespread involvement of L1 and L2 eccentric to the right. Subtotal involvement of the L3 vertebral body. Widespread involvement of L4. Subtotal involvement of the L5 vertebra, with posterior element involvement on the right. Similar Patchy and confluent bilateral sacral metastatic disease. Conus medullaris: Extends to the T12-L1. Level No lower spinal cord or conus signal abnormality. No abnormal intradural enhancement or dural thickening. Paraspinal and other soft tissues: Negative visible abdominal viscera. Diverticulosis of the distal large bowel. Disc levels: Superimposed lumbar  spine degeneration maximal at L4-L5 where mild anterolisthesis is associated with moderate disc and posterior element degeneration resulting in moderate to severe  degenerative spinal stenosis. IMPRESSION: 1. 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 Multiple vertebrae completely replaced by tumor. No pathologic fracture identified. 2. 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 Subsequent malignant cervical spinal stenosis, including at the cervicomedullary junction. Mild cord compression, maximal at C2, but no spinal cord edema. Tumor infiltrates the bilateral C3 and C4 neural foramina. 3. Mild ventral epidural tumor at T10 eccentric to the left. No thoracic cord compression or cord signal abnormality. 4. Superimposed degenerative disease. 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 Electronically Signed   By: Odessa Fleming M.D.   On: 08/23/2021 11:32      ASSESSMENT & PLAN:  1. Bone metastases (HCC)   2. Weight loss   3. Other iron deficiency anemia   4. Other dysphagia   5. Neck pain    #Widespread bone lesions/metastasis throughout his spine, epidural tumor extension at C2 with possible mild cord compression.  He has been seen by neurosurgery CT chest abdomen pelvis has been ordered and is scheduled for tomorrow. Check CBC, CMP, smear, multiple myeloma panel, light chain ratio, flow cytometry, LDH, PSA, May check additional tumor markers depending on CT scan findings.  #Dysphagia, unintentional weight loss, history of Barrett esophagus.  He has not had any EGD surveillance recently.  Will refer patient to reestablish care with [facility] gastroenterology group.  He was previously seen by Dr. Mechele Collin years ago.  #Iron deficiency anemia, check iron TIBC ferritin. #Neck pain, secondary to the bone lesion.  Currently on hydrocodone.   Orders Placed This Encounter  Procedures   Ferritin    Standing Status:   Future    Standing Expiration Date:   02/23/2022   Iron and TIBC    Standing Status:   Future    Standing Expiration Date:   08/26/2022    Comprehensive metabolic panel    Standing Status:   Future    Standing Expiration Date:   08/26/2022   CBC with Differential/Platelet    Standing Status:   Future    Standing Expiration Date:   08/26/2022   Technologist smear review    Standing Status:   Future    Standing Expiration Date:   08/26/2022   Lactate dehydrogenase    Standing Status:   Future    Standing Expiration Date:   08/26/2022   PSA    Standing Status:   Future    Standing Expiration Date:   08/26/2022   Flow cytometry panel-leukemia/lymphoma work-up    Standing Status:   Future    Standing Expiration Date:   08/26/2022   Kappa/lambda light chains    Standing Status:   Future    Standing Expiration Date:   08/26/2022   Multiple Myeloma Panel (SPEP&IFE w/QIG)    Standing Status:   Future    Standing Expiration Date:   08/26/2022   TSH    Standing Status:   Future    Standing Expiration Date:   08/26/2022   Hepatitis panel, acute    Standing Status:   Future    Standing Expiration Date:   08/26/2022   Ambulatory referral to Gastroenterology    Referral Priority:   Routine    Referral Type:   Consultation    Referral Reason:   Specialty Services Required  Referred to Provider:   Efrain Sella, MD    Requested Specialty:   Gastroenterology    Number of Visits Requested:   1    All questions were answered. The patient knows to call the clinic with any problems questions or concerns.  cc Meeler, Whitney L, FNP    Return of visit: to be determined.  Thank you for this kind referral and the opportunity to participate in the care of this patient. A copy of today's note is routed to referring provider   Earlie Server, MD, PhD [facility] Oncology 08/26/2021

## 2021-08-27 ENCOUNTER — Ambulatory Visit
Admission: RE | Admit: 2021-08-27 | Discharge: 2021-08-27 | Disposition: A | Discharge status: Home / Self Care | Payer: Medicare Other | Source: Ambulatory Visit | Attending: Neurosurgery | Admitting: Neurosurgery

## 2021-08-27 DIAGNOSIS — C7951 Secondary malignant neoplasm of bone: Secondary | ICD-10-CM | POA: Insufficient documentation

## 2021-08-27 IMAGING — CT CT CHEST-ABD-PELV W/ CM
2 of 5 series · 13 of 36 positions shown, 15 images · IV contrast (omnipaque)
Comparison: MR total spine, [DATE] CT abdomen pelvis,
[DATE]

CLINICAL DATA: Osseous metastatic disease, history of prostate
cancer, weight loss

EXAM:
CT CHEST, ABDOMEN, AND PELVIS WITH CONTRAST
TECHNIQUE: Multidetector CT imaging of the chest, abdomen and pelvis was
performed following the standard protocol during bolus
administration of intravenous contrast.
CONTRAST:  100mL OMNIPAQUE IOHEXOL 300 MG/ML SOLN, additional oral
enteric contrast

[Series 2: cap with · axial · 0.75mm/px · z∈[-641,-71]mm · 10 of 140 slices shown, 12 images]
[im 13/140  mediastinal]
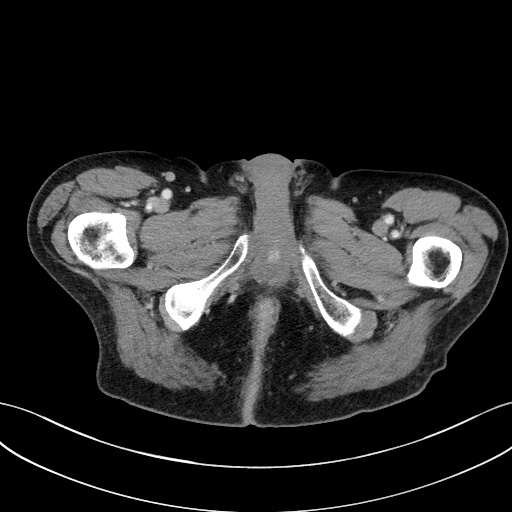
[im 13/140  bone]
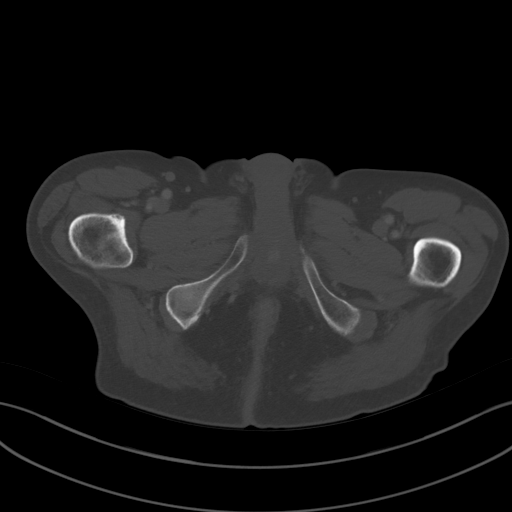
[im 26/140  mediastinal]
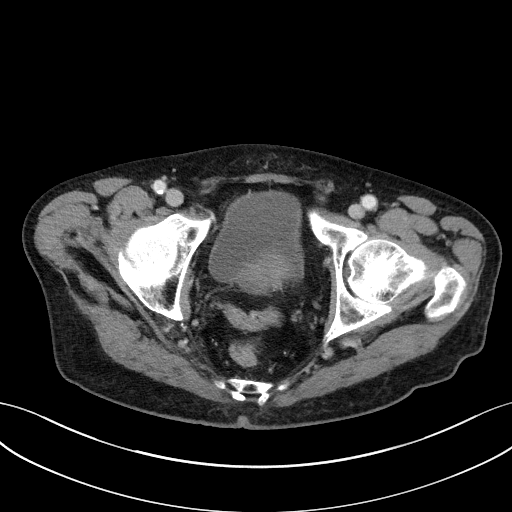
[im 38/140  mediastinal]
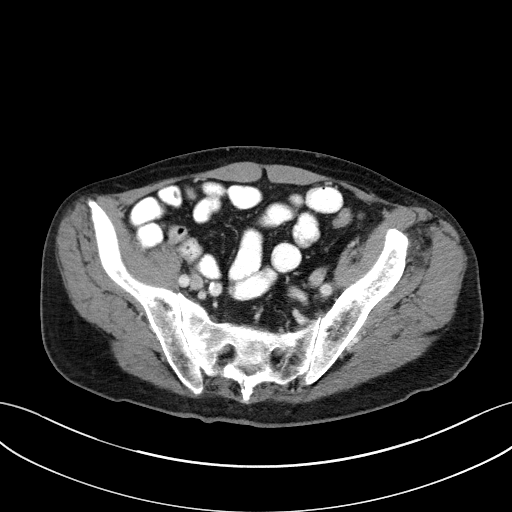
[im 51/140  mediastinal]
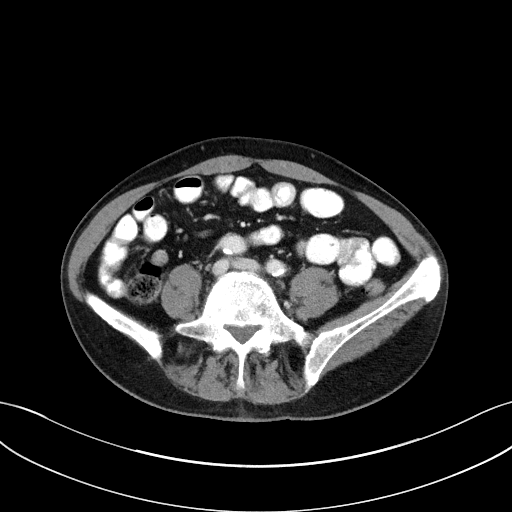
[im 64/140  mediastinal]
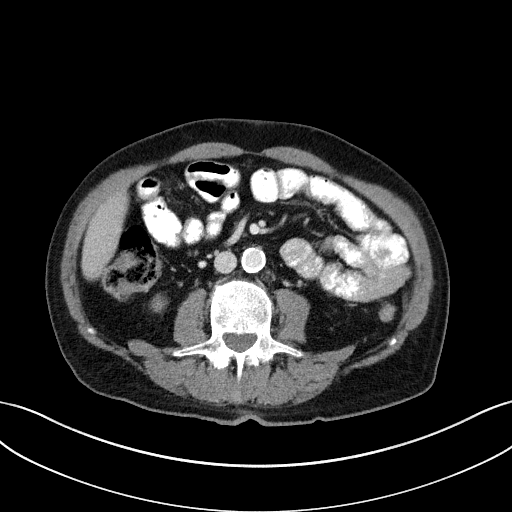
[im 76/140  mediastinal]
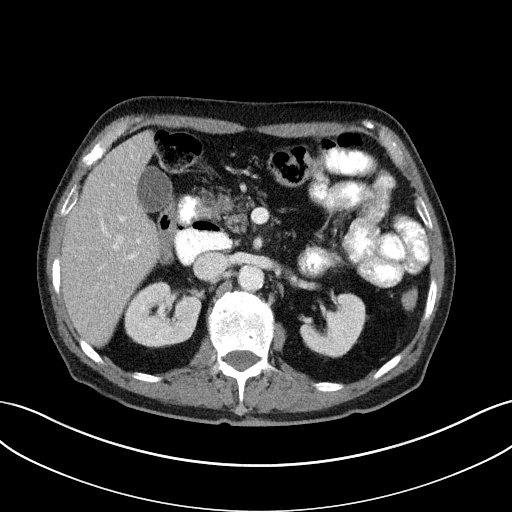
[im 89/140  mediastinal]
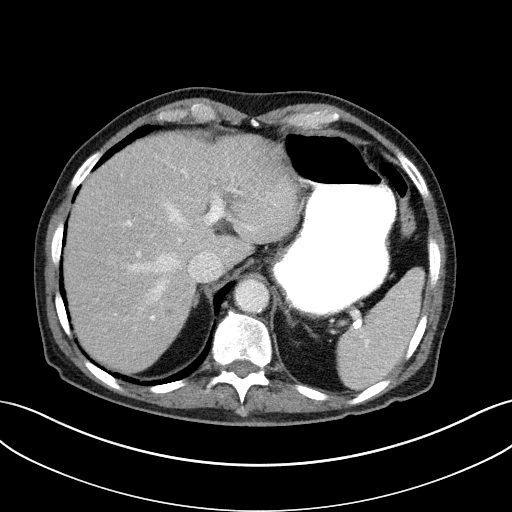
[im 102/140  mediastinal]
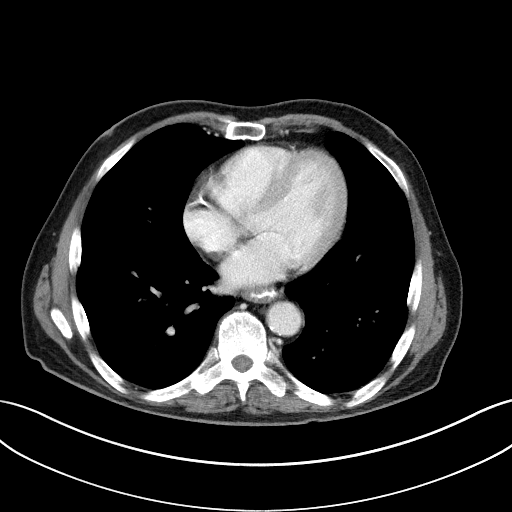
[im 114/140  mediastinal]
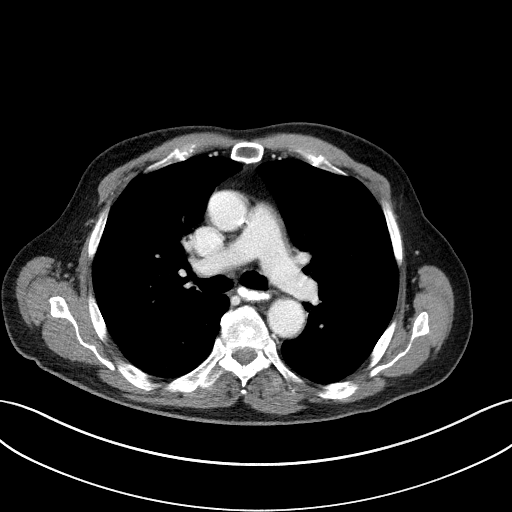
[im 114/140  bone]
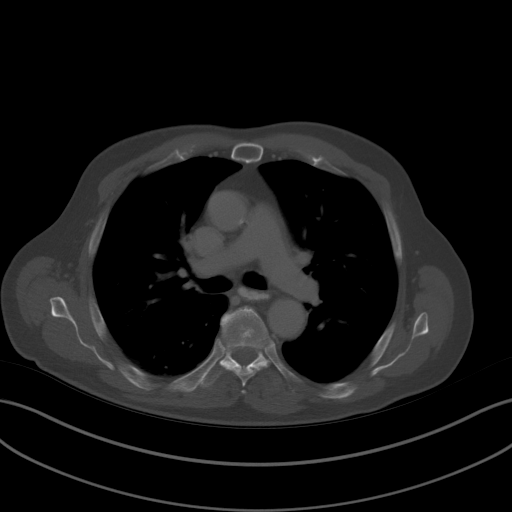
[im 127/140  mediastinal]
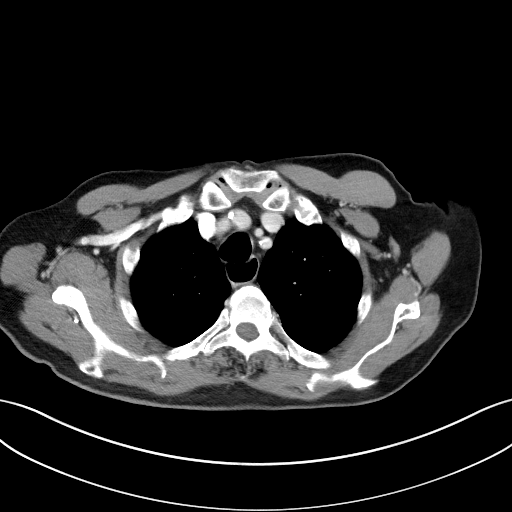

[Series 5: coronals · coronal · 0.73mm/px · 3 of 128 slices shown]
[im 26/128  mediastinal]
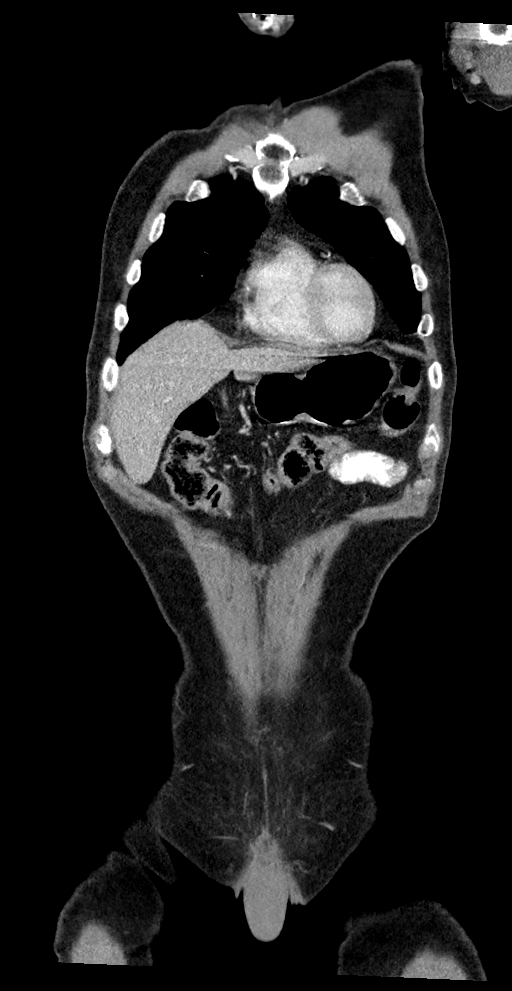
[im 51/128  mediastinal]
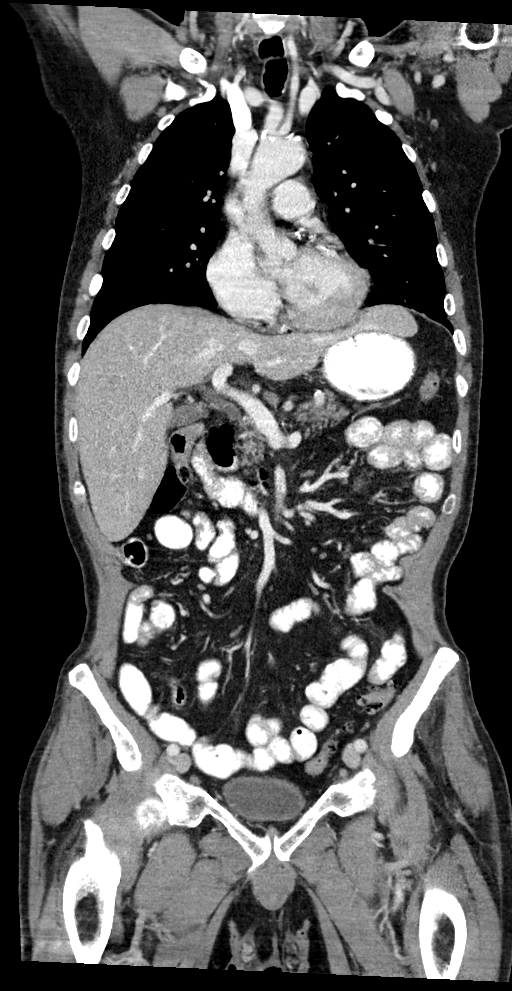
[im 77/128  mediastinal]
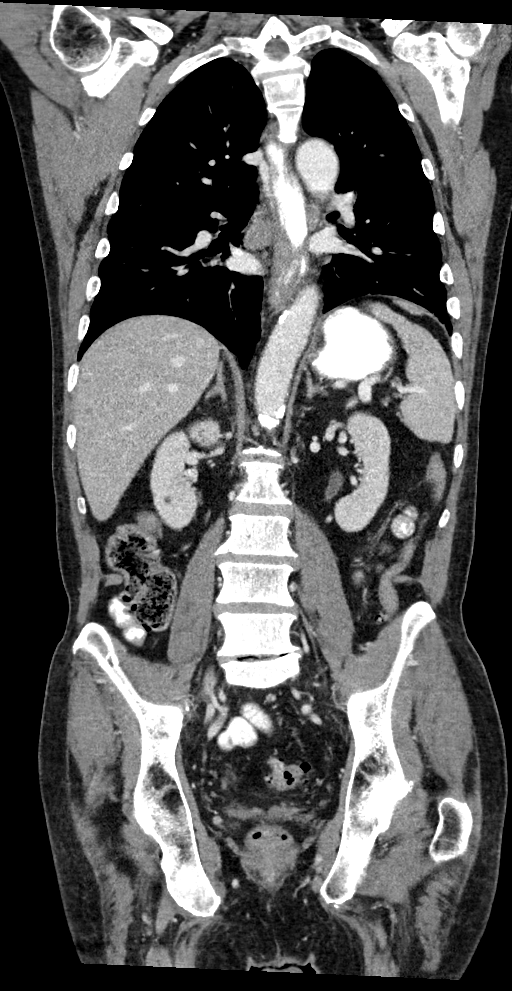

[13 of 36 positions shown; findings below may reference images not displayed]

FINDINGS: CT CHEST FINDINGS

Cardiovascular: Aortic atherosclerosis. Normal heart size.
Three-vessel coronary artery calcifications. No pericardial
effusion.

Mediastinum/Nodes: No enlarged mediastinal, hilar, or axillary lymph
nodes. Thyroid gland, trachea, and esophagus demonstrate no
significant findings.

Lungs/Pleura: Mild dependent bibasilar scarring and or partial
atelectasis. No pleural effusion or pneumothorax.

Musculoskeletal: No chest wall mass.

CT ABDOMEN PELVIS FINDINGS

Hepatobiliary: No solid liver abnormality is seen. No gallstones,
gallbladder wall thickening, or biliary dilatation.

Pancreas: Unremarkable. No pancreatic ductal dilatation or
surrounding inflammatory changes.

Spleen: Normal in size without significant abnormality.

Adrenals/Urinary Tract: Adrenal glands are unremarkable. Kidneys are
normal, without renal calculi, solid lesion, or hydronephrosis.
Bladder is unremarkable.

Stomach/Bowel: Stomach is within normal limits. Appendix appears
normal. No evidence of bowel wall thickening, distention, or
inflammatory changes. Sigmoid diverticulosis.

Vascular/Lymphatic: Aortic atherosclerosis. No enlarged abdominal or
pelvic lymph nodes.

Reproductive: Prostatomegaly.

Other: No abdominal wall hernia or abnormality. No abdominopelvic
ascites.

Musculoskeletal: Widespread osseous metastatic disease throughout
the spine and pelvis is only very subtly appreciated on this
examination, with scattered, somewhat ill-defined sclerotic foci
throughout the vertebral bodies. No overtly lytic lesions
identified. Left eccentric epidural soft tissue at T10 is only very
subtly appreciated by CT (series 2, image 45).
IMPRESSION: 1. Widespread osseous metastatic disease throughout the spine and
pelvis is only very subtly appreciated on this examination, with
scattered, somewhat ill-defined sclerotic foci throughout the
vertebral bodies. No overtly lytic lesions identified.
2. Left eccentric epidural soft tissue at T10 is only very subtly
appreciated by CT.
3. No evidence of primary malignancy, lymphadenopathy or other soft
tissue metastatic disease in the chest, abdomen, or pelvis.
4. Prostatomegaly.
5. Coronary artery disease.

Aortic Atherosclerosis ([QP]-[QP]).

## 2021-08-27 MED ORDER — IOHEXOL 300 MG/ML  SOLN
100.0000 mL | Freq: Once | INTRAMUSCULAR | Status: AC | PRN
  Administered 2021-08-27: 100 mL via INTRAVENOUS

## 2021-08-30 ENCOUNTER — Inpatient Hospital Stay: Payer: Medicare Other

## 2021-08-30 ENCOUNTER — Other Ambulatory Visit: Payer: Self-pay

## 2021-08-30 DIAGNOSIS — C61 Malignant neoplasm of prostate: Secondary | ICD-10-CM | POA: Diagnosis present

## 2021-08-30 DIAGNOSIS — C7951 Secondary malignant neoplasm of bone: Secondary | ICD-10-CM | POA: Diagnosis not present

## 2021-08-30 DIAGNOSIS — R634 Abnormal weight loss: Secondary | ICD-10-CM | POA: Diagnosis not present

## 2021-08-30 DIAGNOSIS — R1319 Other dysphagia: Secondary | ICD-10-CM | POA: Diagnosis not present

## 2021-08-30 DIAGNOSIS — D649 Anemia, unspecified: Secondary | ICD-10-CM | POA: Diagnosis not present

## 2021-08-30 DIAGNOSIS — D509 Iron deficiency anemia, unspecified: Secondary | ICD-10-CM | POA: Diagnosis not present

## 2021-08-30 DIAGNOSIS — C155 Malignant neoplasm of lower third of esophagus: Secondary | ICD-10-CM | POA: Diagnosis not present

## 2021-08-30 LAB — CBC WITH DIFFERENTIAL/PLATELET
Abs Immature Granulocytes: 0.09 10*3/uL — ABNORMAL HIGH (ref 0.00–0.07)
Basophils Absolute: 0 10*3/uL (ref 0.0–0.1)
Basophils Relative: 1 %
Eosinophils Absolute: 0.1 10*3/uL (ref 0.0–0.5)
Eosinophils Relative: 1 %
HCT: 21.3 % — ABNORMAL LOW (ref 39.0–52.0)
Hemoglobin: 7.1 g/dL — ABNORMAL LOW (ref 13.0–17.0)
Immature Granulocytes: 2 %
Lymphocytes Relative: 26 %
Lymphs Abs: 1.2 10*3/uL (ref 0.7–4.0)
MCH: 33.2 pg (ref 26.0–34.0)
MCHC: 33.3 g/dL (ref 30.0–36.0)
MCV: 99.5 fL (ref 80.0–100.0)
Monocytes Absolute: 0.4 10*3/uL (ref 0.1–1.0)
Monocytes Relative: 9 %
Neutro Abs: 2.9 10*3/uL (ref 1.7–7.7)
Neutrophils Relative %: 61 %
Platelets: 190 10*3/uL (ref 150–400)
RBC: 2.14 MIL/uL — ABNORMAL LOW (ref 4.22–5.81)
RDW: 16.4 % — ABNORMAL HIGH (ref 11.5–15.5)
WBC: 4.8 10*3/uL (ref 4.0–10.5)
nRBC: 1 % — ABNORMAL HIGH (ref 0.0–0.2)

## 2021-08-30 LAB — COMPREHENSIVE METABOLIC PANEL
ALT: 62 U/L — ABNORMAL HIGH (ref 0–44)
AST: 105 U/L — ABNORMAL HIGH (ref 15–41)
Albumin: 3.7 g/dL (ref 3.5–5.0)
Alkaline Phosphatase: 254 U/L — ABNORMAL HIGH (ref 38–126)
Anion gap: 7 (ref 5–15)
BUN: 11 mg/dL (ref 8–23)
CO2: 27 mmol/L (ref 22–32)
Calcium: 8.4 mg/dL — ABNORMAL LOW (ref 8.9–10.3)
Chloride: 101 mmol/L (ref 98–111)
Creatinine, Ser: 0.81 mg/dL (ref 0.61–1.24)
GFR, Estimated: >60 mL/min (ref >60)
Glucose, Bld: 147 mg/dL — ABNORMAL HIGH (ref 70–99)
Potassium: 3.5 mmol/L (ref 3.5–5.1)
Sodium: 135 mmol/L (ref 135–145)
Total Bilirubin: 0.1 mg/dL — ABNORMAL LOW (ref 0.3–1.2)
Total Protein: 6.8 g/dL (ref 6.5–8.1)

## 2021-08-30 LAB — TECHNOLOGIST SMEAR REVIEW
Plt Morphology: NORMAL
RBC MORPHOLOGY: NORMAL
WBC MORPHOLOGY: NORMAL

## 2021-08-30 LAB — IRON AND TIBC
Iron: 27 ug/dL — ABNORMAL LOW (ref 45–182)
Saturation Ratios: 6 % — ABNORMAL LOW (ref 17.9–39.5)
TIBC: 428 ug/dL (ref 250–450)
UIBC: 401 ug/dL

## 2021-08-30 LAB — HEPATITIS PANEL, ACUTE
HCV Ab: NONREACTIVE
Hep A IgM: NONREACTIVE
Hep B C IgM: NONREACTIVE
Hepatitis B Surface Ag: NONREACTIVE

## 2021-08-30 LAB — FERRITIN: Ferritin: 55 ng/mL (ref 24–336)

## 2021-08-30 LAB — TSH: TSH: 3.111 u[IU]/mL (ref 0.350–4.500)

## 2021-08-30 LAB — LACTATE DEHYDROGENASE: LDH: 677 U/L — ABNORMAL HIGH (ref 98–192)

## 2021-08-30 LAB — PSA: Prostatic Specific Antigen: 1.62 ng/mL (ref 0.00–4.00)

## 2021-09-01 ENCOUNTER — Telehealth: Payer: Self-pay | Admitting: Oncology

## 2021-09-01 ENCOUNTER — Encounter: Payer: Self-pay | Admitting: Emergency Medicine

## 2021-09-01 ENCOUNTER — Telehealth: Payer: Self-pay | Admitting: *Deleted

## 2021-09-01 ENCOUNTER — Other Ambulatory Visit: Payer: Self-pay

## 2021-09-01 ENCOUNTER — Emergency Department: Payer: Medicare Other

## 2021-09-01 DIAGNOSIS — M069 Rheumatoid arthritis, unspecified: Secondary | ICD-10-CM | POA: Diagnosis present

## 2021-09-01 DIAGNOSIS — C7951 Secondary malignant neoplasm of bone: Secondary | ICD-10-CM | POA: Diagnosis present

## 2021-09-01 DIAGNOSIS — Z833 Family history of diabetes mellitus: Secondary | ICD-10-CM

## 2021-09-01 DIAGNOSIS — K219 Gastro-esophageal reflux disease without esophagitis: Secondary | ICD-10-CM | POA: Diagnosis present

## 2021-09-01 DIAGNOSIS — E782 Mixed hyperlipidemia: Secondary | ICD-10-CM | POA: Diagnosis present

## 2021-09-01 DIAGNOSIS — K640 First degree hemorrhoids: Secondary | ICD-10-CM | POA: Diagnosis present

## 2021-09-01 DIAGNOSIS — M7122 Synovial cyst of popliteal space [Baker], left knee: Secondary | ICD-10-CM | POA: Diagnosis present

## 2021-09-01 DIAGNOSIS — N3281 Overactive bladder: Secondary | ICD-10-CM | POA: Diagnosis present

## 2021-09-01 DIAGNOSIS — Z20822 Contact with and (suspected) exposure to covid-19: Secondary | ICD-10-CM | POA: Diagnosis present

## 2021-09-01 DIAGNOSIS — C155 Malignant neoplasm of lower third of esophagus: Principal | ICD-10-CM | POA: Diagnosis present

## 2021-09-01 DIAGNOSIS — I2510 Atherosclerotic heart disease of native coronary artery without angina pectoris: Secondary | ICD-10-CM | POA: Diagnosis present

## 2021-09-01 DIAGNOSIS — N400 Enlarged prostate without lower urinary tract symptoms: Secondary | ICD-10-CM | POA: Diagnosis present

## 2021-09-01 DIAGNOSIS — R1314 Dysphagia, pharyngoesophageal phase: Secondary | ICD-10-CM | POA: Diagnosis present

## 2021-09-01 DIAGNOSIS — K5730 Diverticulosis of large intestine without perforation or abscess without bleeding: Secondary | ICD-10-CM | POA: Diagnosis present

## 2021-09-01 DIAGNOSIS — Z87891 Personal history of nicotine dependence: Secondary | ICD-10-CM

## 2021-09-01 DIAGNOSIS — D509 Iron deficiency anemia, unspecified: Secondary | ICD-10-CM | POA: Diagnosis present

## 2021-09-01 LAB — BASIC METABOLIC PANEL
Anion gap: 7 (ref 5–15)
BUN: 12 mg/dL (ref 8–23)
CO2: 25 mmol/L (ref 22–32)
Calcium: 8.4 mg/dL — ABNORMAL LOW (ref 8.9–10.3)
Chloride: 105 mmol/L (ref 98–111)
Creatinine, Ser: 0.72 mg/dL (ref 0.61–1.24)
GFR, Estimated: >60 mL/min (ref >60)
Glucose, Bld: 108 mg/dL — ABNORMAL HIGH (ref 70–99)
Potassium: 4.3 mmol/L (ref 3.5–5.1)
Sodium: 137 mmol/L (ref 135–145)

## 2021-09-01 LAB — TROPONIN I (HIGH SENSITIVITY): Troponin I (High Sensitivity): 8 ng/L (ref <18)

## 2021-09-01 LAB — CBC
HCT: 22.2 % — ABNORMAL LOW (ref 39.0–52.0)
Hemoglobin: 7 g/dL — ABNORMAL LOW (ref 13.0–17.0)
MCH: 31.3 pg (ref 26.0–34.0)
MCHC: 31.5 g/dL (ref 30.0–36.0)
MCV: 99.1 fL (ref 80.0–100.0)
Platelets: 228 10*3/uL (ref 150–400)
RBC: 2.24 MIL/uL — ABNORMAL LOW (ref 4.22–5.81)
RDW: 15.8 % — ABNORMAL HIGH (ref 11.5–15.5)
WBC: 5.2 10*3/uL (ref 4.0–10.5)
nRBC: 0.8 % — ABNORMAL HIGH (ref 0.0–0.2)

## 2021-09-01 LAB — COMP PANEL: LEUKEMIA/LYMPHOMA: Immunophenotypic Profile: 1

## 2021-09-01 IMAGING — CR DG CHEST 2V
2 series · 2 of 2 positions shown · non-contrast
Comparison: Chest CT [DATE]

CLINICAL DATA: Shortness of breath.

EXAM:
CHEST - 2 VIEW

[chest pa]
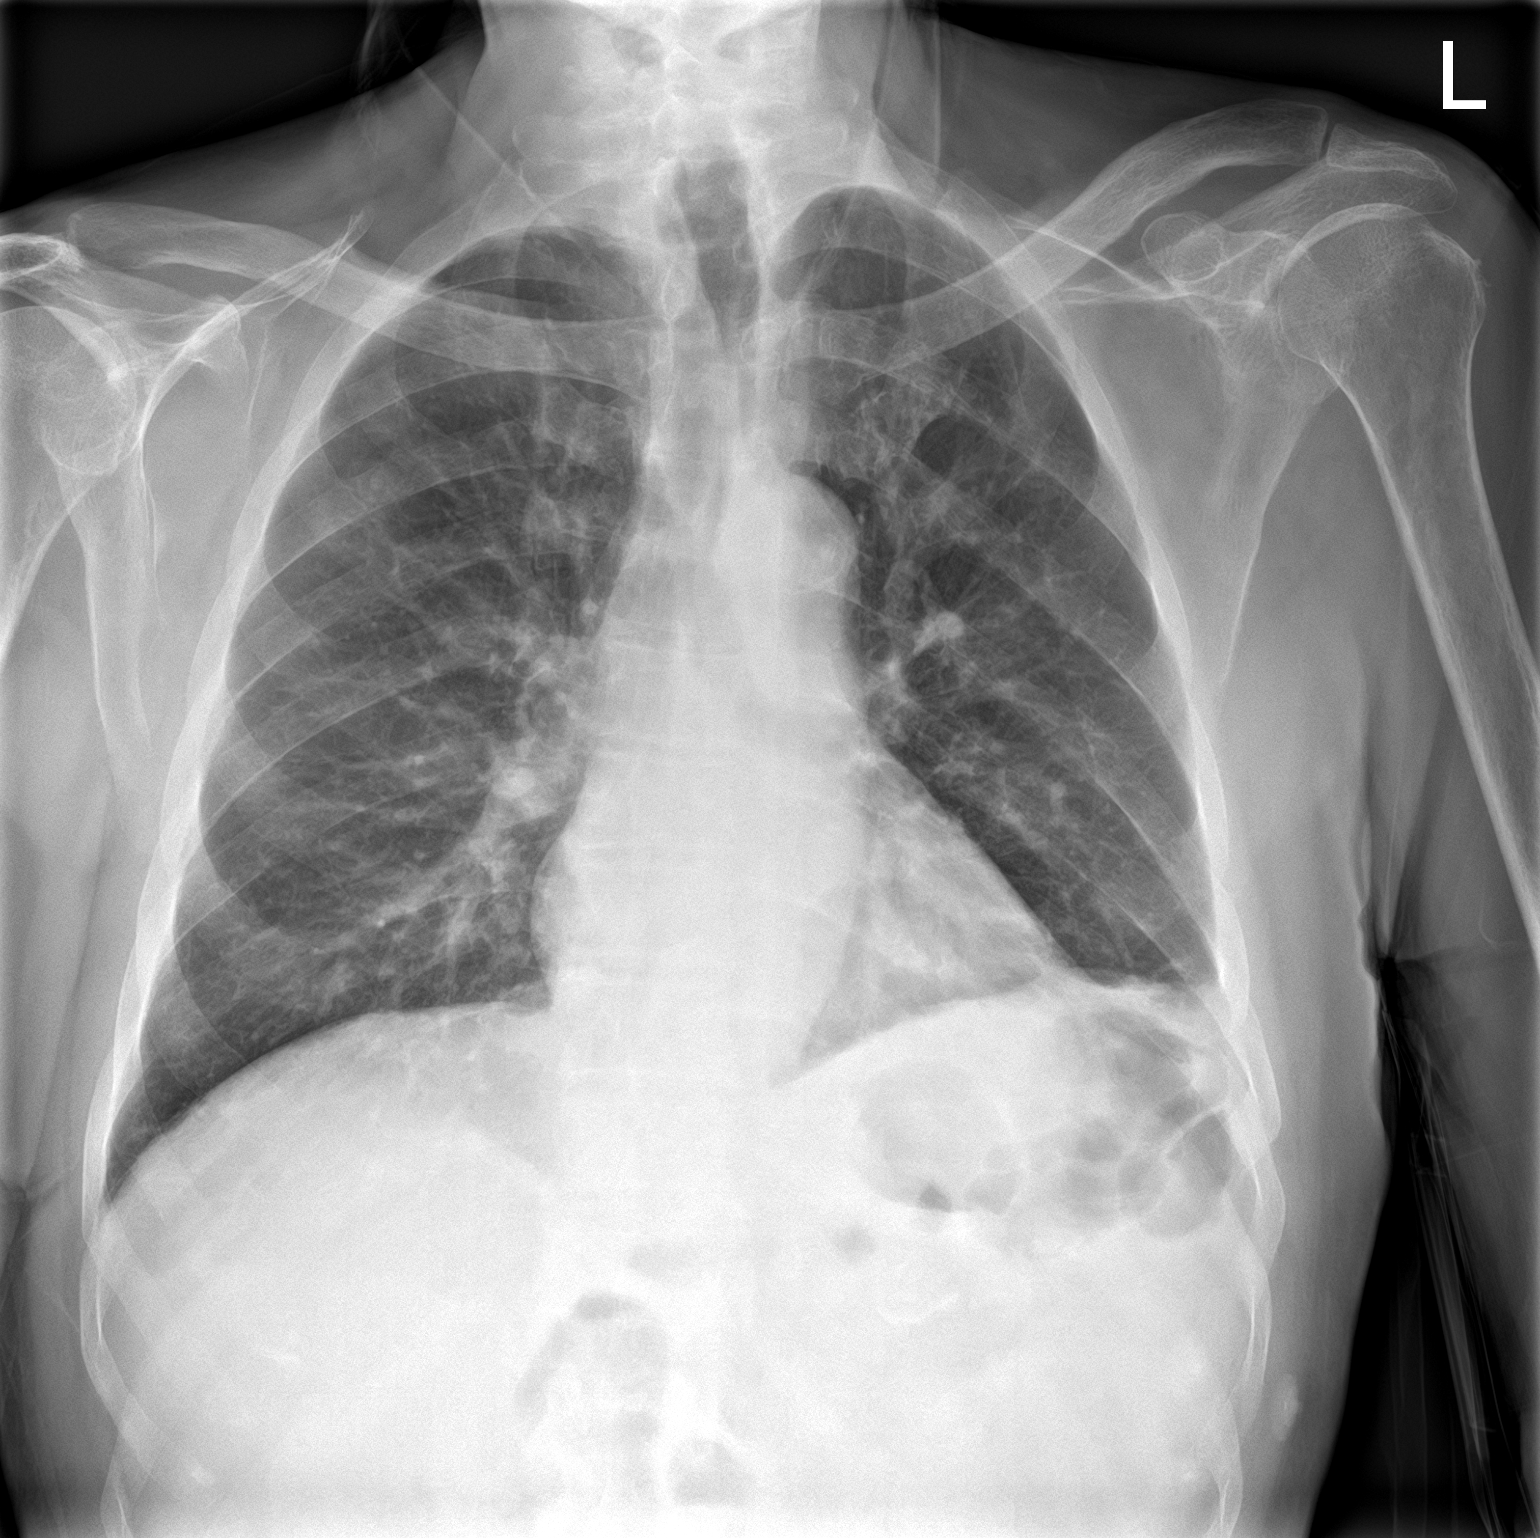

[chest lat]
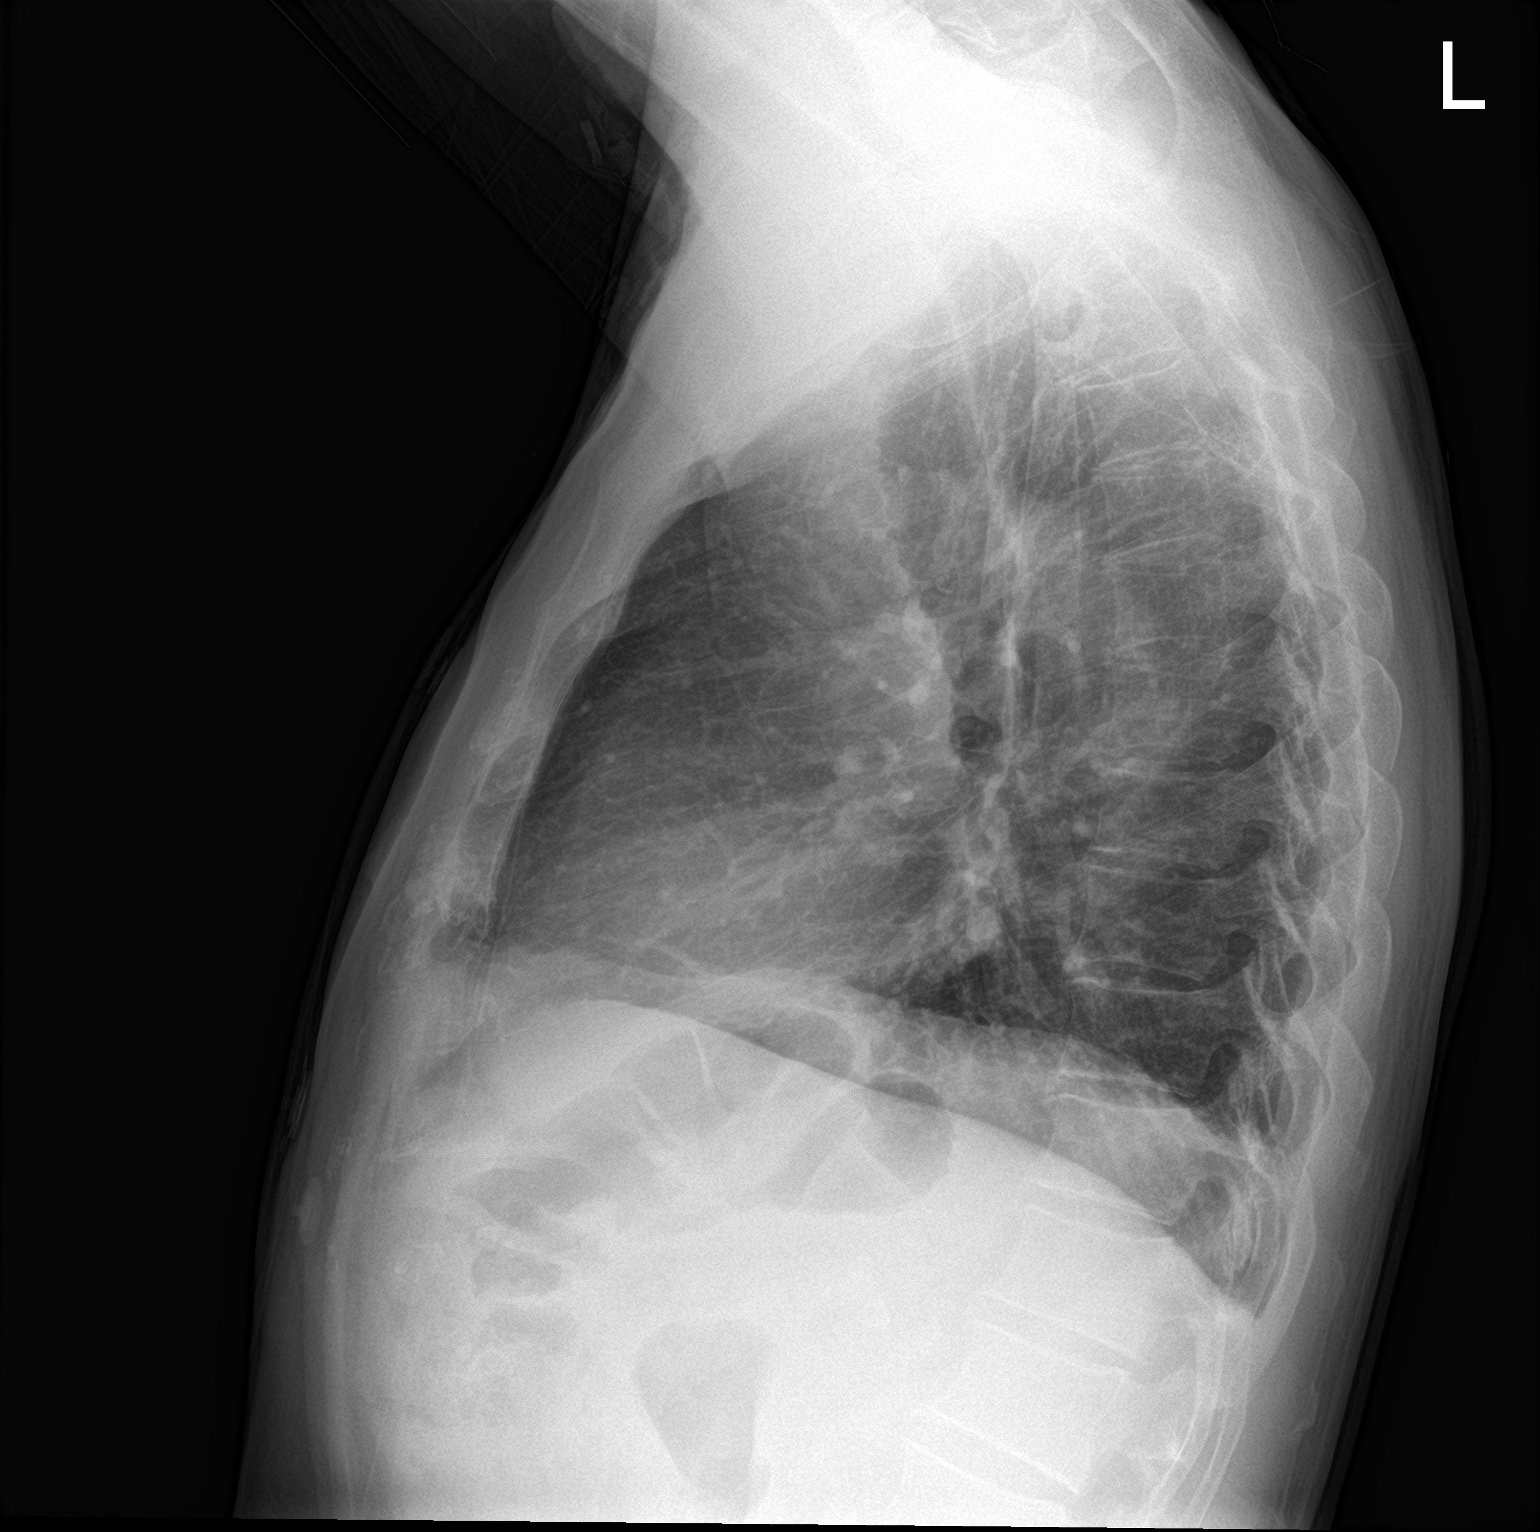

[2 of 2 positions shown; findings below may reference images not displayed]

FINDINGS: The heart is normal in size. Normal mediastinal contours. Slight
increase in left lung base atelectasis from recent CT. No confluent
consolidation. No pleural effusion, pneumothorax, or pulmonary
edema. Patient with known osseous metastatic disease, lesions not
well seen on the current exam.
IMPRESSION: Slight increase in left lung base atelectasis from recent CT.

## 2021-09-01 NOTE — Telephone Encounter (Signed)
Called patient and discussed with him about his lab results. Multiple myeloma panel is still pending. Patient has a hemoglobin of 7.1, iron panel is consistent with iron deficiency.  Patient also reports dysphagia and has been referred to [facility] gastroenterology group for further work-up outpatient.  He has not establish care yet. Given that he has had symptomatic anemia, acute drop of hemoglobin, suspect GI bleeding.  Recommend patient to go to the emergency room get blood transfusion and further evaluation.  He agrees with the plan.

## 2021-09-01 NOTE — Telephone Encounter (Signed)
Please advise on lab results. Pt does not have follow up scheduled. 12/7 is MOC.

## 2021-09-01 NOTE — ED Triage Notes (Signed)
Pt to ED from home sent by PCP after blood work 2 days ago for low hemoglobin of 7.1 and needing blood transfusion.  Pt states over 4-6 weeks has been feeling SOB and worse with exercise, states pain around left shoulder and back of neck.  Testing by PCP showed cancer in his neck but no treatments have been started.  Pt A&Ox4, chest rise even and unlabored, skin pale, in NAD at this time.  Denies bleeding at home.

## 2021-09-01 NOTE — Telephone Encounter (Signed)
Patient called to request a review of his results if possible. He has an appointment on 12/7 but does not want to wait that long.

## 2021-09-02 ENCOUNTER — Emergency Department: Payer: Medicare Other

## 2021-09-02 ENCOUNTER — Inpatient Hospital Stay
Admission: EM | Admit: 2021-09-02 | Discharge: 2021-09-03 | DRG: 375 | Disposition: A | Discharge status: Home / Self Care | Payer: Medicare Other | Attending: Internal Medicine | Admitting: Internal Medicine

## 2021-09-02 DIAGNOSIS — C155 Malignant neoplasm of lower third of esophagus: Secondary | ICD-10-CM | POA: Diagnosis present

## 2021-09-02 DIAGNOSIS — D649 Anemia, unspecified: Secondary | ICD-10-CM | POA: Diagnosis present

## 2021-09-02 DIAGNOSIS — D500 Iron deficiency anemia secondary to blood loss (chronic): Secondary | ICD-10-CM | POA: Diagnosis not present

## 2021-09-02 DIAGNOSIS — Z87891 Personal history of nicotine dependence: Secondary | ICD-10-CM | POA: Diagnosis not present

## 2021-09-02 DIAGNOSIS — I2510 Atherosclerotic heart disease of native coronary artery without angina pectoris: Secondary | ICD-10-CM | POA: Diagnosis present

## 2021-09-02 DIAGNOSIS — K219 Gastro-esophageal reflux disease without esophagitis: Secondary | ICD-10-CM

## 2021-09-02 DIAGNOSIS — R1319 Other dysphagia: Secondary | ICD-10-CM | POA: Diagnosis not present

## 2021-09-02 DIAGNOSIS — Z833 Family history of diabetes mellitus: Secondary | ICD-10-CM | POA: Diagnosis not present

## 2021-09-02 DIAGNOSIS — D509 Iron deficiency anemia, unspecified: Secondary | ICD-10-CM | POA: Diagnosis present

## 2021-09-02 DIAGNOSIS — N3281 Overactive bladder: Secondary | ICD-10-CM | POA: Diagnosis present

## 2021-09-02 DIAGNOSIS — R1314 Dysphagia, pharyngoesophageal phase: Secondary | ICD-10-CM | POA: Diagnosis present

## 2021-09-02 DIAGNOSIS — K5730 Diverticulosis of large intestine without perforation or abscess without bleeding: Secondary | ICD-10-CM | POA: Diagnosis present

## 2021-09-02 DIAGNOSIS — C7951 Secondary malignant neoplasm of bone: Secondary | ICD-10-CM

## 2021-09-02 DIAGNOSIS — M7122 Synovial cyst of popliteal space [Baker], left knee: Secondary | ICD-10-CM | POA: Diagnosis present

## 2021-09-02 DIAGNOSIS — M069 Rheumatoid arthritis, unspecified: Secondary | ICD-10-CM

## 2021-09-02 DIAGNOSIS — K2289 Other specified disease of esophagus: Secondary | ICD-10-CM

## 2021-09-02 DIAGNOSIS — Z20822 Contact with and (suspected) exposure to covid-19: Secondary | ICD-10-CM | POA: Diagnosis present

## 2021-09-02 DIAGNOSIS — N400 Enlarged prostate without lower urinary tract symptoms: Secondary | ICD-10-CM | POA: Diagnosis present

## 2021-09-02 DIAGNOSIS — E782 Mixed hyperlipidemia: Secondary | ICD-10-CM | POA: Diagnosis present

## 2021-09-02 DIAGNOSIS — K640 First degree hemorrhoids: Secondary | ICD-10-CM | POA: Diagnosis present

## 2021-09-02 LAB — BASIC METABOLIC PANEL
Anion gap: 10 (ref 5–15)
BUN: 11 mg/dL (ref 8–23)
CO2: 25 mmol/L (ref 22–32)
Calcium: 8.4 mg/dL — ABNORMAL LOW (ref 8.9–10.3)
Chloride: 103 mmol/L (ref 98–111)
Creatinine, Ser: 0.7 mg/dL (ref 0.61–1.24)
GFR, Estimated: >60 mL/min (ref >60)
Glucose, Bld: 109 mg/dL — ABNORMAL HIGH (ref 70–99)
Potassium: 3.9 mmol/L (ref 3.5–5.1)
Sodium: 138 mmol/L (ref 135–145)

## 2021-09-02 LAB — CBC
HCT: 24.5 % — ABNORMAL LOW (ref 39.0–52.0)
Hemoglobin: 8.3 g/dL — ABNORMAL LOW (ref 13.0–17.0)
MCH: 32.3 pg (ref 26.0–34.0)
MCHC: 33.9 g/dL (ref 30.0–36.0)
MCV: 95.3 fL (ref 80.0–100.0)
Platelets: 212 10*3/uL (ref 150–400)
RBC: 2.57 MIL/uL — ABNORMAL LOW (ref 4.22–5.81)
RDW: 15.6 % — ABNORMAL HIGH (ref 11.5–15.5)
WBC: 4.7 10*3/uL (ref 4.0–10.5)
nRBC: 0.4 % — ABNORMAL HIGH (ref 0.0–0.2)

## 2021-09-02 LAB — MULTIPLE MYELOMA PANEL, SERUM
Albumin SerPl Elph-Mcnc: 3.4 g/dL (ref 2.9–4.4)
Albumin/Glob SerPl: 1.2 (ref 0.7–1.7)
Alpha 1: 0.4 g/dL (ref 0.0–0.4)
Alpha2 Glob SerPl Elph-Mcnc: 0.7 g/dL (ref 0.4–1.0)
B-Globulin SerPl Elph-Mcnc: 1 g/dL (ref 0.7–1.3)
Gamma Glob SerPl Elph-Mcnc: 0.8 g/dL (ref 0.4–1.8)
Globulin, Total: 2.9 g/dL (ref 2.2–3.9)
IgA: 117 mg/dL (ref 61–437)
IgG (Immunoglobin G), Serum: 637 mg/dL (ref 603–1613)
IgM (Immunoglobulin M), Srm: 190 mg/dL — ABNORMAL HIGH (ref 15–143)
M Protein SerPl Elph-Mcnc: 0.2 g/dL — ABNORMAL HIGH
Total Protein ELP: 6.3 g/dL (ref 6.0–8.5)

## 2021-09-02 LAB — LACTATE DEHYDROGENASE: LDH: 469 U/L — ABNORMAL HIGH (ref 98–192)

## 2021-09-02 LAB — PROTIME-INR
INR: 1.2 (ref 0.8–1.2)
Prothrombin Time: 15.6 seconds — ABNORMAL HIGH (ref 11.4–15.2)

## 2021-09-02 LAB — IRON AND TIBC
Iron: 37 ug/dL — ABNORMAL LOW (ref 45–182)
Saturation Ratios: 8 % — ABNORMAL LOW (ref 17.9–39.5)
TIBC: 463 ug/dL — ABNORMAL HIGH (ref 250–450)
UIBC: 426 ug/dL

## 2021-09-02 LAB — RETICULOCYTES
Immature Retic Fract: 30.6 % — ABNORMAL HIGH (ref 2.3–15.9)
RBC.: 2.2 MIL/uL — ABNORMAL LOW (ref 4.22–5.81)
Retic Count, Absolute: 165 10*3/uL (ref 19.0–186.0)
Retic Ct Pct: 7.5 % — ABNORMAL HIGH (ref 0.4–3.1)

## 2021-09-02 LAB — HEMOGLOBIN AND HEMATOCRIT, BLOOD
HCT: 26.3 % — ABNORMAL LOW (ref 39.0–52.0)
Hemoglobin: 8.7 g/dL — ABNORMAL LOW (ref 13.0–17.0)

## 2021-09-02 LAB — PREPARE RBC (CROSSMATCH)

## 2021-09-02 LAB — FERRITIN: Ferritin: 33 ng/mL (ref 24–336)

## 2021-09-02 LAB — FOLATE: Folate: 32 ng/mL (ref >5.9)

## 2021-09-02 LAB — RESP PANEL BY RT-PCR (FLU A&B, COVID) ARPGX2
Influenza A by PCR: NEGATIVE
Influenza B by PCR: NEGATIVE
SARS Coronavirus 2 by RT PCR: NEGATIVE

## 2021-09-02 LAB — VITAMIN B12: Vitamin B-12: 584 pg/mL (ref 180–914)

## 2021-09-02 LAB — ABO/RH: ABO/RH(D): O POS

## 2021-09-02 LAB — TROPONIN I (HIGH SENSITIVITY): Troponin I (High Sensitivity): 9 ng/L (ref <18)

## 2021-09-02 IMAGING — CT CT ANGIO CHEST
2 of 7 series · 18 of 46 positions shown · IV contrast (APPLIED)
Comparison: Chest x-ray from the previous day, CT from [DATE].

CLINICAL DATA: Shortness of breath

EXAM:
CT ANGIOGRAPHY CHEST WITH CONTRAST
TECHNIQUE: Multidetector CT imaging of the chest was performed using the
standard protocol during bolus administration of intravenous
contrast. Multiplanar CT image reconstructions and MIPs were
obtained to evaluate the vascular anatomy.
CONTRAST:  75mL OMNIPAQUE IOHEXOL 350 MG/ML SOLN

[Series 5: thins · axial · 0.72mm/px · z∈[-290,-30]mm · 15 of 360 slices shown]
[im 18/360  lung]
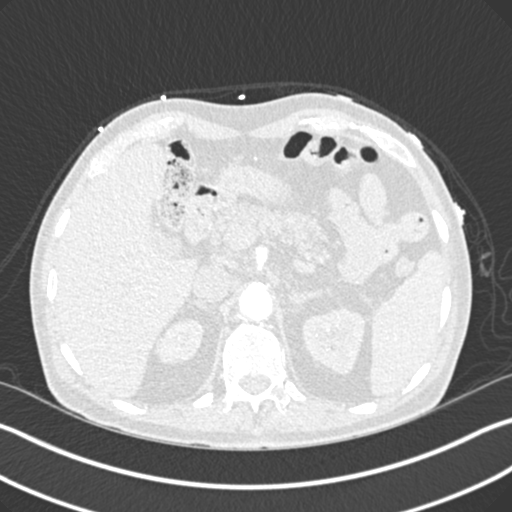
[im 36/360  soft-tissue]
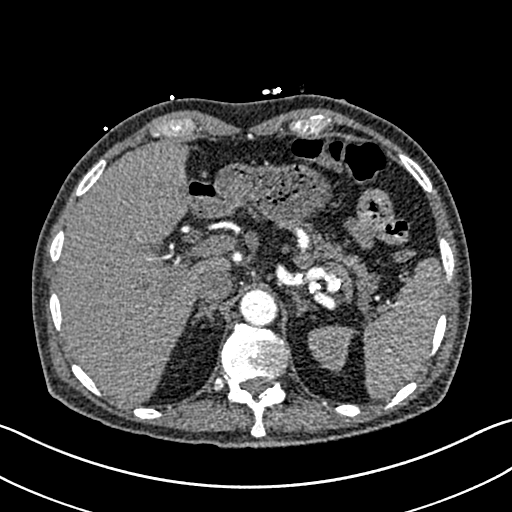
[im 72/360  lung]
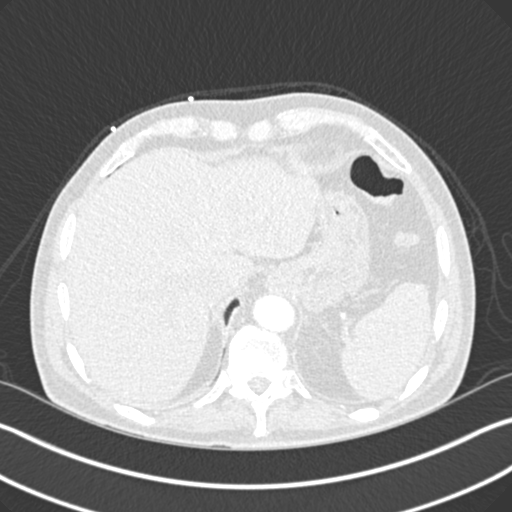
[im 90/360  soft-tissue]
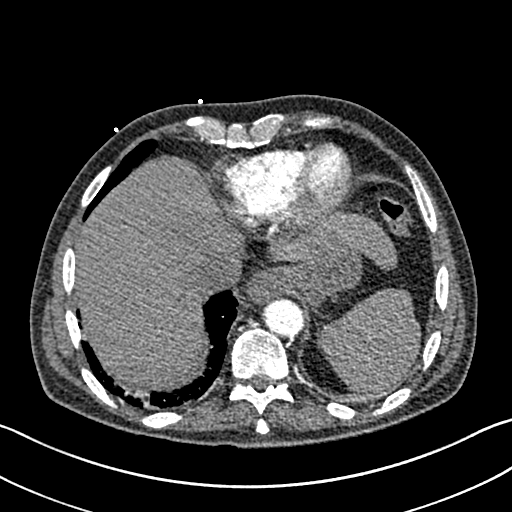
[im 108/360  lung]
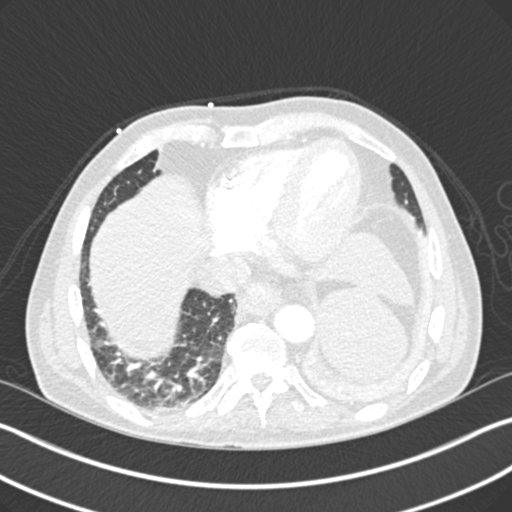
[im 126/360  soft-tissue]
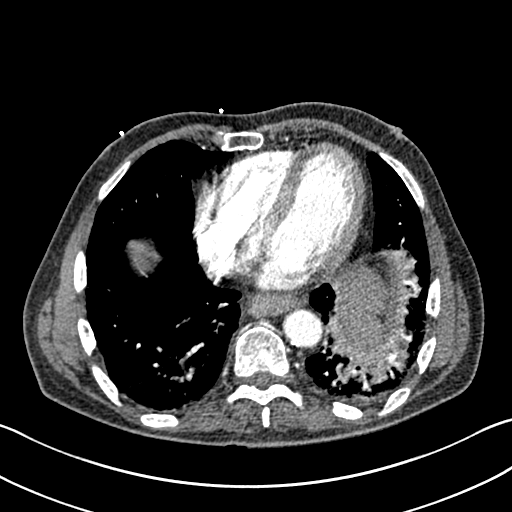
[im 162/360  lung]
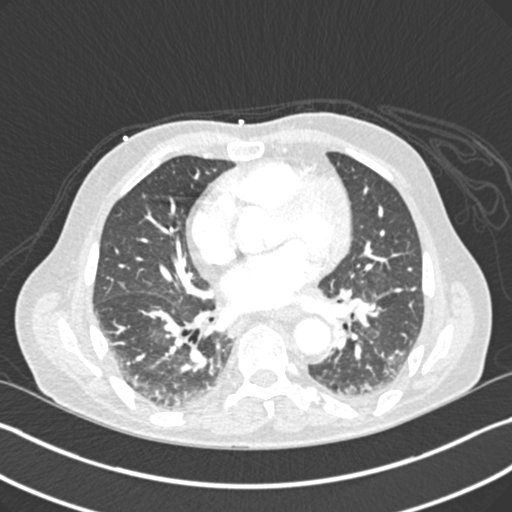
[im 180/360  soft-tissue]
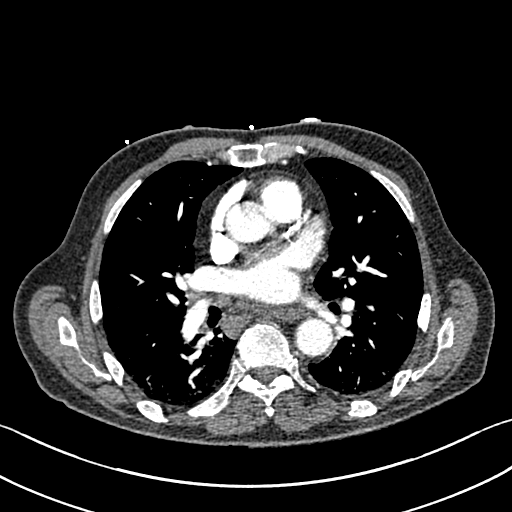
[im 198/360  lung]
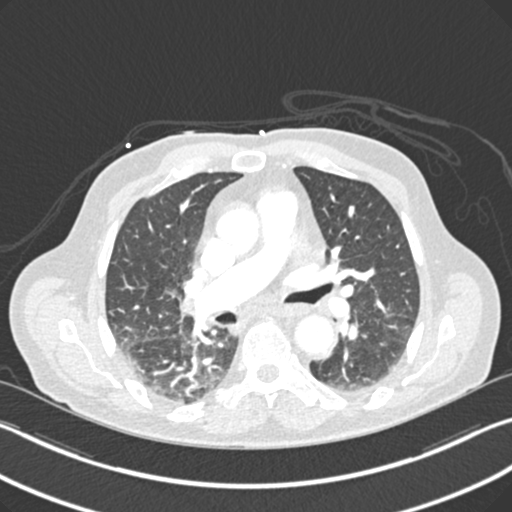
[im 234/360  soft-tissue]
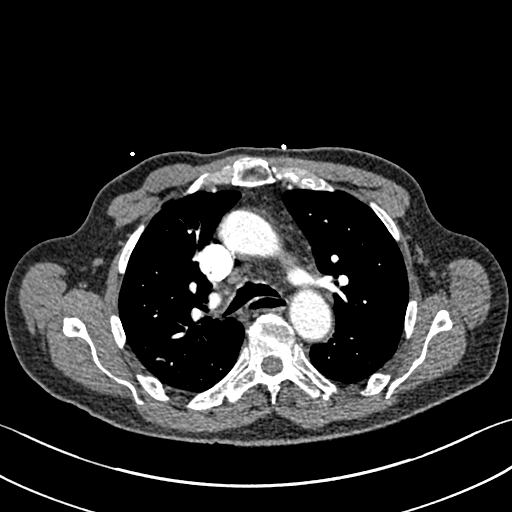
[im 252/360  lung]
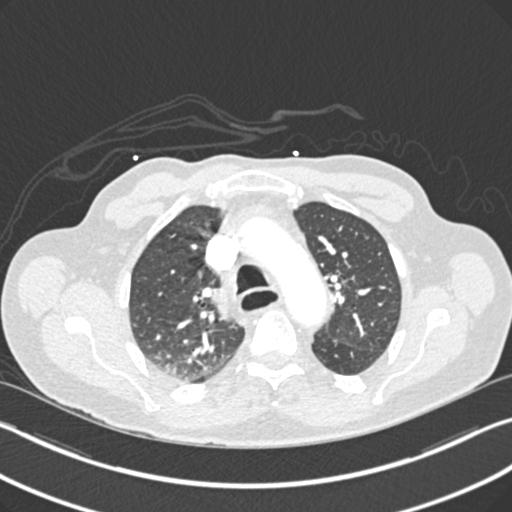
[im 270/360  soft-tissue]
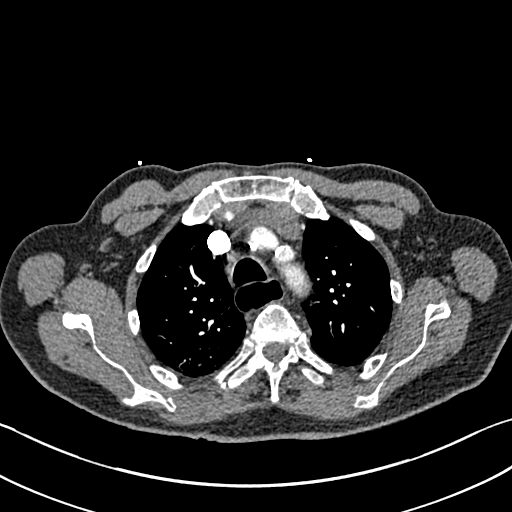
[im 288/360  lung]
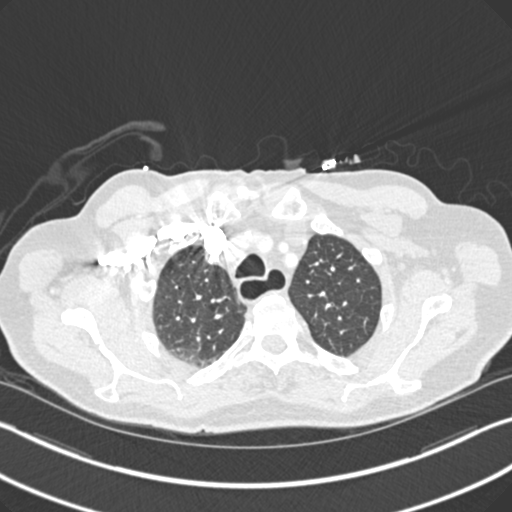
[im 324/360  soft-tissue]
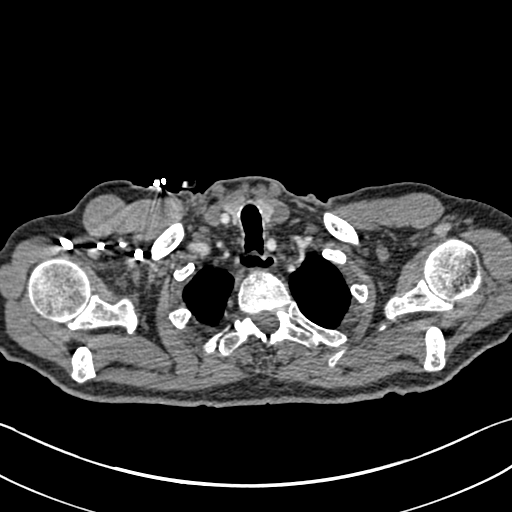
[im 342/360  lung]
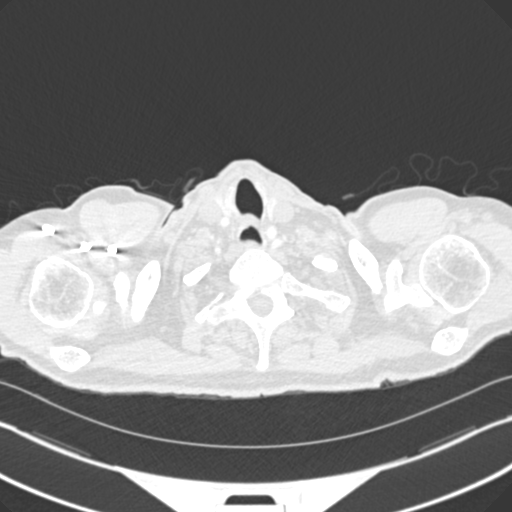

[Series 7: coronal mpr · coronal · 0.56mm/px · 3 of 84 slices shown]
[im 21/84  soft-tissue]
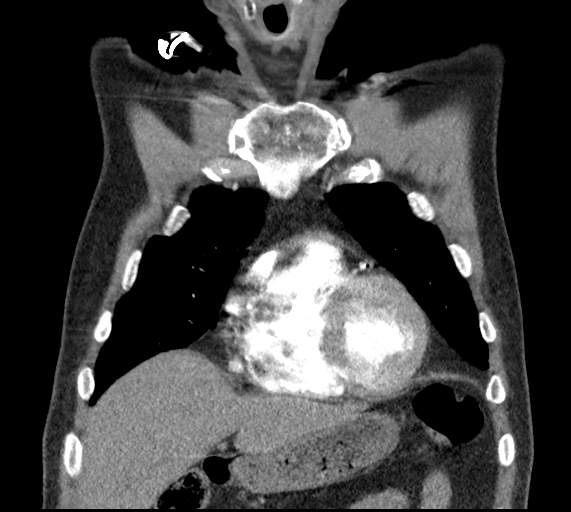
[im 42/84  soft-tissue]
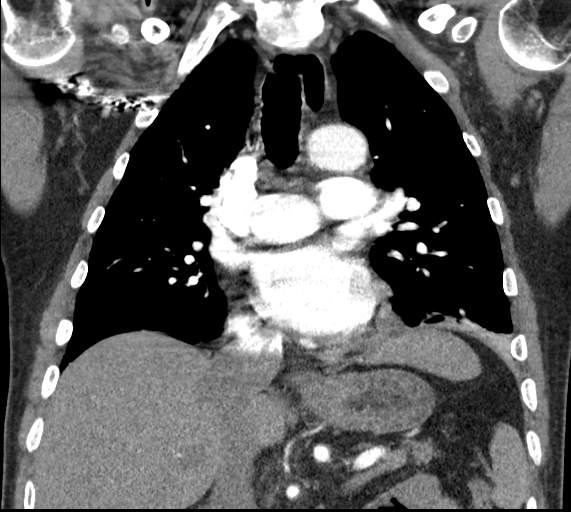
[im 63/84  soft-tissue]
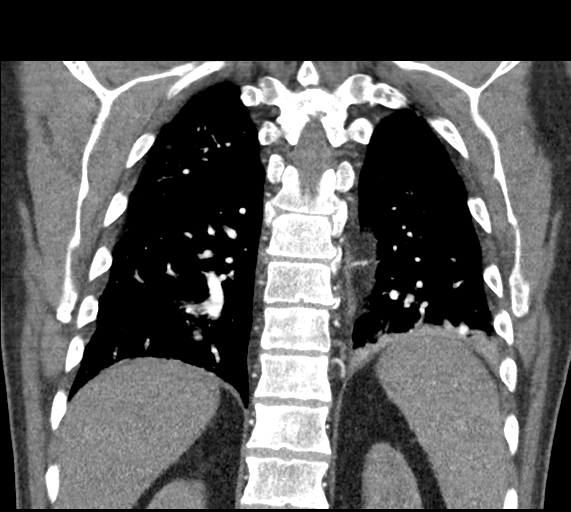

[18 of 46 positions shown; findings below may reference images not displayed]

FINDINGS: Cardiovascular: Thoracic aorta demonstrates atherosclerotic
calcifications without aneurysmal dilatation or dissection. No
cardiac enlargement is noted. Coronary calcifications are seen.
Pulmonary artery shows a normal branching pattern. No filling defect
to suggest pulmonary embolism is noted.

Mediastinum/Nodes: Thoracic inlet is within normal limits. No
sizable hilar or mediastinal adenopathy is noted. The esophagus is
air-filled and patulous.

Lungs/Pleura: Lungs are well aerated bilaterally. Left lower lobe
atelectasis is seen similar to that noted on recent chest x-ray. No
sizable effusion is noted. No focal infiltrate on the right is
noted. No parenchymal nodules are seen.

Upper Abdomen: Visualized upper abdomen is within normal limits.

Musculoskeletal: Subtle areas of increased sclerosis are noted
throughout the thoracic spine consistent with the known history of
metastatic disease. Similar findings are noted within the ribcage.

Review of the MIP images confirms the above findings.
IMPRESSION: No evidence of pulmonary emboli.

Scattered mild subtle areas of increased sclerosis consistent with
known metastatic disease within the thoracic spine and to a lesser
degree in the ribcage.

Slight increase in left lower lobe atelectasis when compare with the
prior CT similar to that seen on recent chest x-ray.

Aortic Atherosclerosis ([1I]-[1I]).

## 2021-09-02 IMAGING — US US EXTREM LOW VENOUS*L*
1 series · 13 of 24 positions shown · non-contrast
Comparison: None.

CLINICAL DATA: Left leg pain and swelling for several days



[Series 1: us venous img lower uni left (dvt) · portal-venous · 13 of 40 slices shown]
[im 1/40]
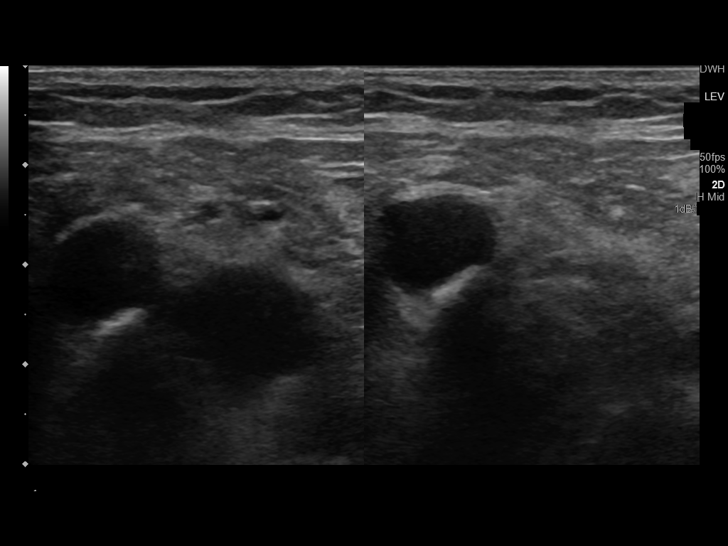
[im 4/40]
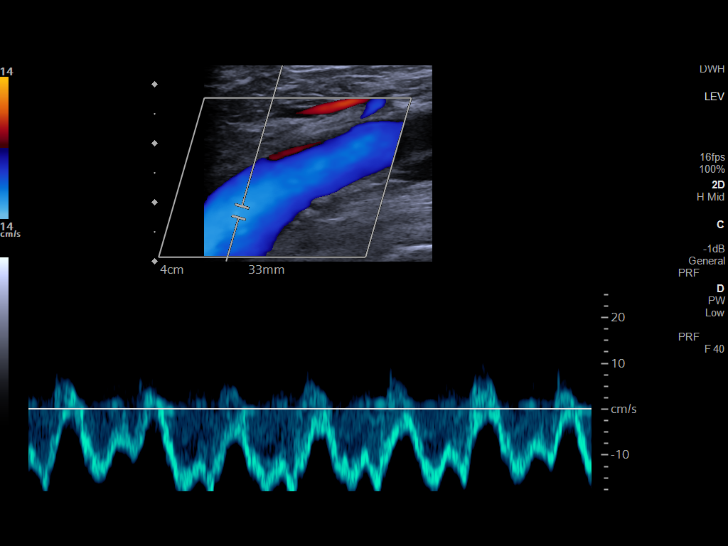
[im 7/40]
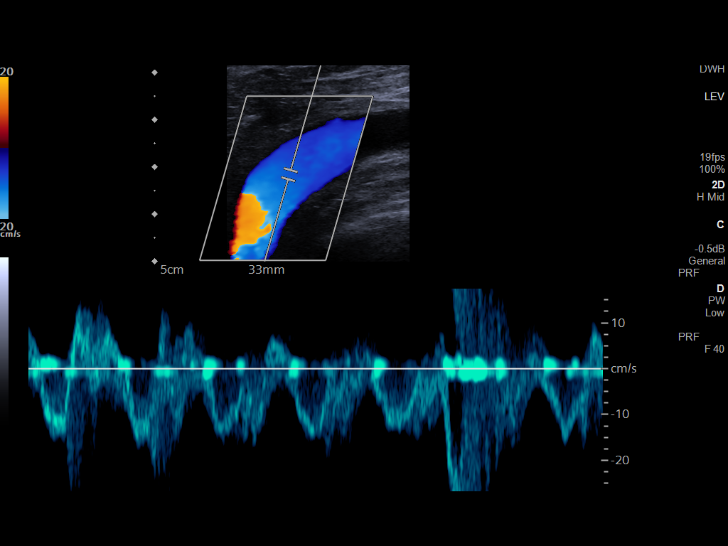
[im 11/40]
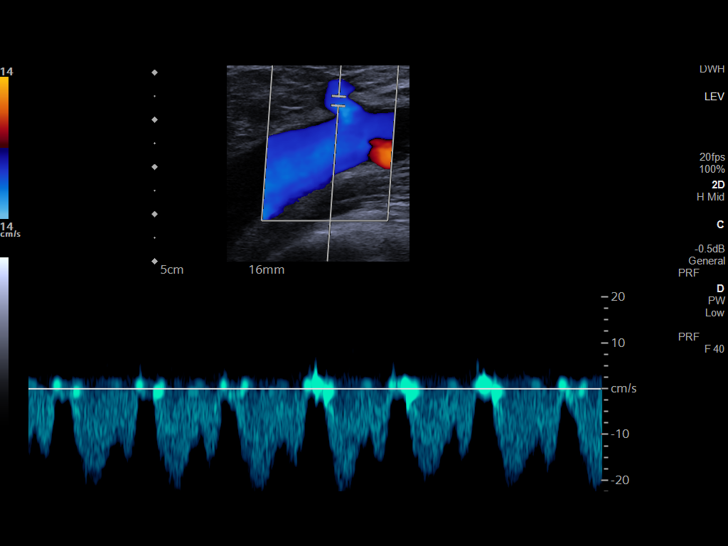
[im 14/40]
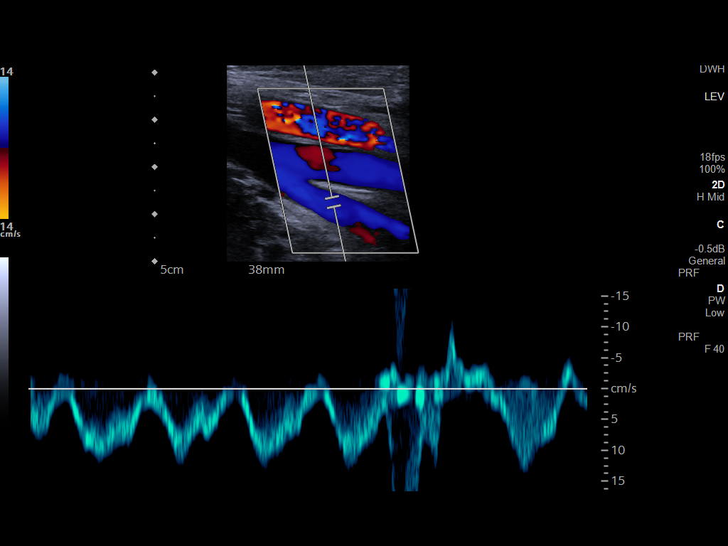
[im 17/40]
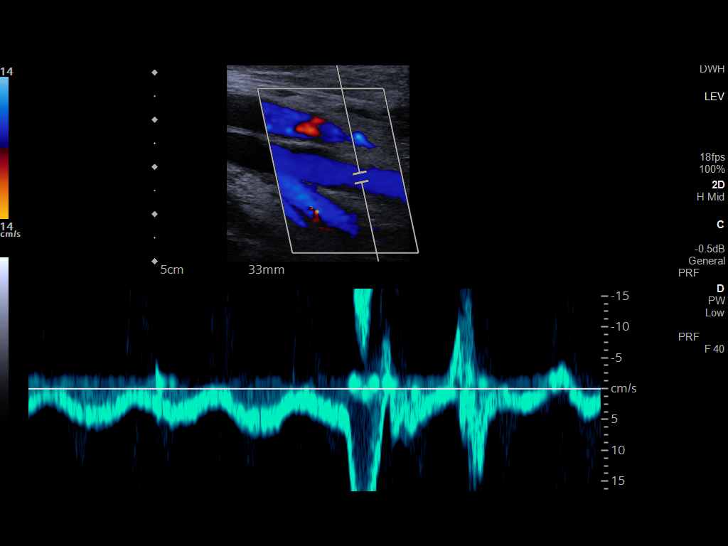
[im 21/40]
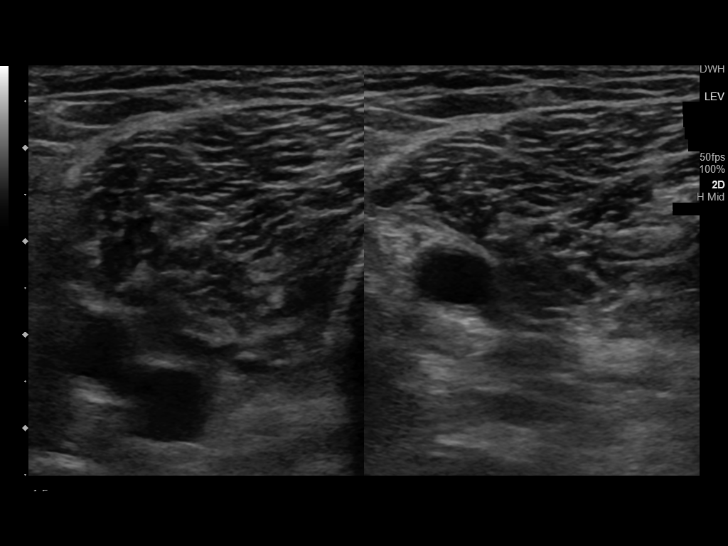
[im 23/40]
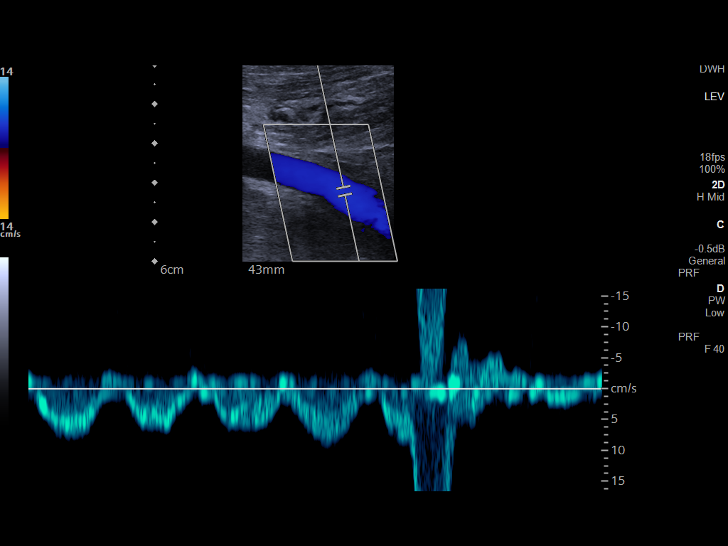
[im 26/40]
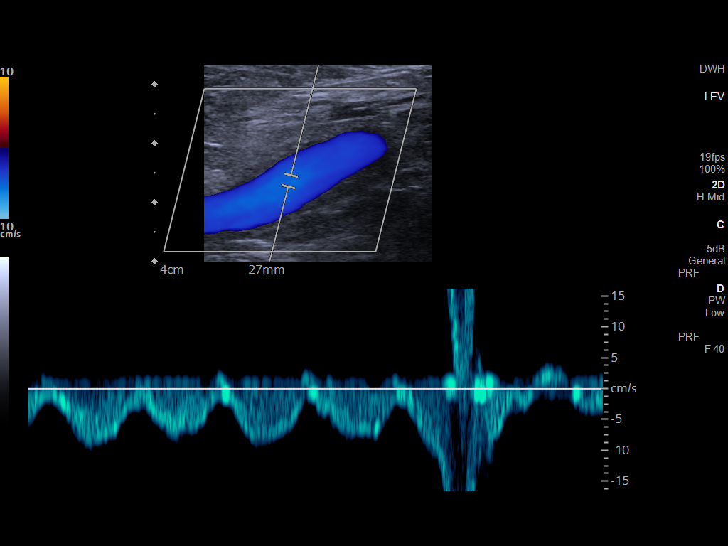
[im 29/40]
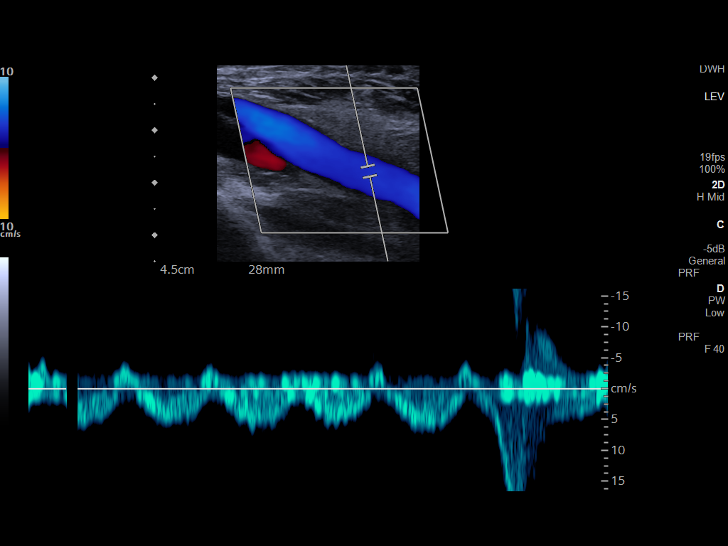
[im 33/40]
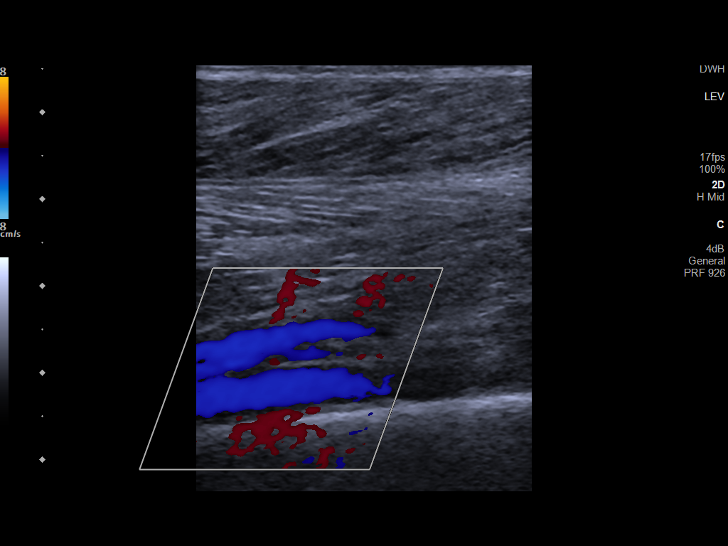
[im 36/40]
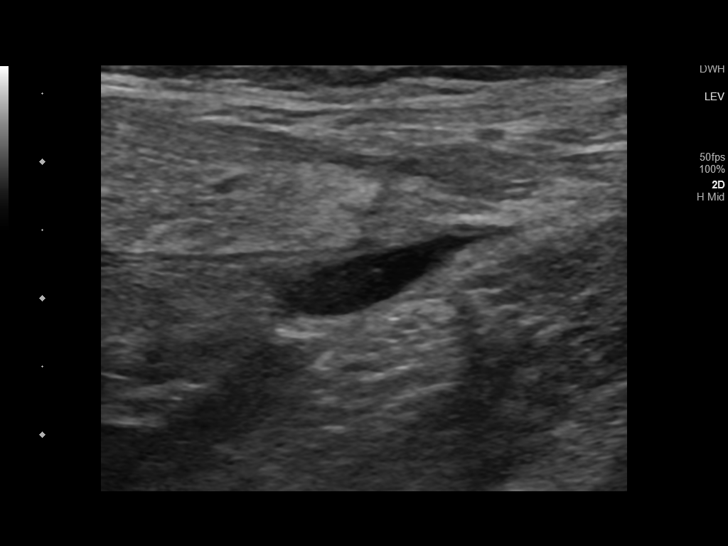
[im 40/40]
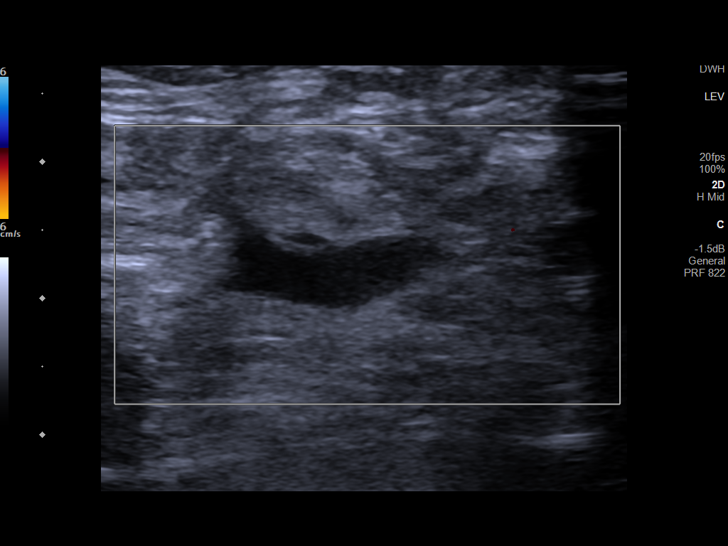

[13 of 24 positions shown; findings below may reference images not displayed]

FINDINGS: Contralateral Common Femoral Vein: Respiratory phasicity is normal
and symmetric with the symptomatic side. No evidence of thrombus.
Normal compressibility.

Common Femoral Vein: No evidence of thrombus. Normal
compressibility, respiratory phasicity and response to augmentation.

Saphenofemoral Junction: No evidence of thrombus. Normal
compressibility and flow on color Doppler imaging.

Profunda Femoral Vein: No evidence of thrombus. Normal
compressibility and flow on color Doppler imaging.

Femoral Vein: No evidence of thrombus. Normal compressibility,
respiratory phasicity and response to augmentation.

Popliteal Vein: No evidence of thrombus. Normal compressibility,
respiratory phasicity and response to augmentation.

Calf Veins: No evidence of thrombus. Normal compressibility and flow
on color Doppler imaging.

Superficial Great Saphenous Vein: No evidence of thrombus. Normal
compressibility.

Venous Reflux:  None.

Other Findings: Complex fluid collection is noted in the left
popliteal fossa measuring 2.3 x 1.6 cm consistent with a popliteal
cyst.
IMPRESSION: No evidence of deep venous thrombosis.

Small popliteal cyst.

## 2021-09-02 MED ORDER — TRAZODONE HCL 50 MG PO TABS: 25.0000 mg | ORAL_TABLET | Freq: Every evening | ORAL | Status: DC | PRN

## 2021-09-02 MED ORDER — HYDROCODONE-ACETAMINOPHEN 5-325 MG PO TABS
1.0000 | ORAL_TABLET | ORAL | Status: DC | PRN
  Administered 2021-09-02 – 2021-09-03 (×6): 1 via ORAL
  Filled 2021-09-02 (×6): qty 1

## 2021-09-02 MED ORDER — SODIUM CHLORIDE 0.9 % IV SOLN
200.0000 mg | Freq: Every day | INTRAVENOUS | Status: AC
  Administered 2021-09-02 – 2021-09-03 (×2): 200 mg via INTRAVENOUS
  Filled 2021-09-02: qty 200
  Filled 2021-09-02: qty 10

## 2021-09-02 MED ORDER — MORPHINE SULFATE (PF) 2 MG/ML IV SOLN
2.0000 mg | INTRAVENOUS | Status: DC | PRN
  Administered 2021-09-02: 06:00:00 2 mg via INTRAVENOUS
  Filled 2021-09-02: qty 1

## 2021-09-02 MED ORDER — MAGNESIUM HYDROXIDE 400 MG/5ML PO SUSP
30.0000 mL | Freq: Every day | ORAL | Status: DC | PRN
  Filled 2021-09-02: qty 30

## 2021-09-02 MED ORDER — ACETAMINOPHEN 325 MG PO TABS: 650.0000 mg | ORAL_TABLET | Freq: Four times a day (QID) | ORAL | Status: DC | PRN

## 2021-09-02 MED ORDER — HYDROCODONE-ACETAMINOPHEN 5-325 MG PO TABS: 1.0000 | ORAL_TABLET | Freq: Two times a day (BID) | ORAL | Status: DC

## 2021-09-02 MED ORDER — SODIUM CHLORIDE 0.9 % IV SOLN: INTRAVENOUS | Status: DC

## 2021-09-02 MED ORDER — PANTOPRAZOLE SODIUM 40 MG PO TBEC
40.0000 mg | DELAYED_RELEASE_TABLET | Freq: Two times a day (BID) | ORAL | Status: DC
  Administered 2021-09-02 – 2021-09-03 (×3): 40 mg via ORAL
  Filled 2021-09-02 (×3): qty 1

## 2021-09-02 MED ORDER — ACETAMINOPHEN 650 MG RE SUPP: 650.0000 mg | Freq: Four times a day (QID) | RECTAL | Status: DC | PRN

## 2021-09-02 MED ORDER — FOLIC ACID 1 MG PO TABS
1.0000 mg | ORAL_TABLET | Freq: Every day | ORAL | Status: DC
  Administered 2021-09-02 – 2021-09-03 (×2): 1 mg via ORAL
  Filled 2021-09-02 (×2): qty 1

## 2021-09-02 MED ORDER — ADULT MULTIVITAMIN W/MINERALS CH
ORAL_TABLET | Freq: Every day | ORAL | Status: DC
  Administered 2021-09-03: 1 via ORAL
  Filled 2021-09-02: qty 1

## 2021-09-02 MED ORDER — ENOXAPARIN SODIUM 40 MG/0.4ML IJ SOSY: 40.0000 mg | PREFILLED_SYRINGE | INTRAMUSCULAR | Status: DC

## 2021-09-02 MED ORDER — MIRABEGRON ER 50 MG PO TB24
50.0000 mg | ORAL_TABLET | Freq: Every day | ORAL | Status: DC
  Administered 2021-09-02 – 2021-09-03 (×2): 50 mg via ORAL
  Filled 2021-09-02 (×2): qty 1

## 2021-09-02 MED ORDER — BISACODYL 5 MG PO TBEC: 20.0000 mg | DELAYED_RELEASE_TABLET | Freq: Once | ORAL | Status: DC

## 2021-09-02 MED ORDER — ONDANSETRON HCL 4 MG PO TABS: 4.0000 mg | ORAL_TABLET | Freq: Four times a day (QID) | ORAL | Status: DC | PRN

## 2021-09-02 MED ORDER — PANTOPRAZOLE SODIUM 40 MG PO TBEC: 40.0000 mg | DELAYED_RELEASE_TABLET | Freq: Every day | ORAL | Status: DC

## 2021-09-02 MED ORDER — POLYETHYLENE GLYCOL 3350 17 GM/SCOOP PO POWD
1.0000 | Freq: Once | ORAL | Status: AC
  Administered 2021-09-02: 20:00:00 255 g via ORAL
  Filled 2021-09-02: qty 255

## 2021-09-02 MED ORDER — IOHEXOL 350 MG/ML SOLN
75.0000 mL | Freq: Once | INTRAVENOUS | Status: AC | PRN
  Administered 2021-09-02: 03:00:00 75 mL via INTRAVENOUS

## 2021-09-02 MED ORDER — SODIUM CHLORIDE 0.9 % IV SOLN: 10.0000 mL/h | Freq: Once | INTRAVENOUS | Status: DC

## 2021-09-02 MED ORDER — ONDANSETRON HCL 4 MG/2ML IJ SOLN: 4.0000 mg | Freq: Four times a day (QID) | INTRAMUSCULAR | Status: DC | PRN

## 2021-09-02 NOTE — ED Notes (Signed)
Hospitalist at bedside 

## 2021-09-02 NOTE — H&P (Addendum)
[place]   PATIENT NAME: Stuart Mcneil    MR#:  [id number]  DATE OF BIRTH:  [birth year]  DATE OF ADMISSION:  09/02/2021  PRIMARY CARE PHYSICIAN: Bella Kennedy, MD   Patient is coming from: Home  REQUESTING/REFERRING PHYSICIAN: Ward, Delice Bison, DO  CHIEF COMPLAINT:   Chief Complaint  Patient presents with   Anemia   Blood Transfusion    HISTORY OF PRESENT ILLNESS:  Stuart Mcneil is a 80 y.o. 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 Tasia Catchings today with hemoglobin of 7.1.  The patient's last hemoglobin was 13.2 with hematocrit 37.9 on 08/06/2021.  He denies any nausea or vomiting or heartburn, hematemesis or melena or bright red bleeding per rectum.  He admits to recent dyspnea with associated dizziness and generalized weakness.  He was complaining of left shoulder and neck pain for which Norco has been helping.  No cough or wheezing or hemoptysis.  No dysuria, oliguria or hematuria or flank pain.  No other bleeding diathesis.  No chest pain or palpitations.  He was also complaining of left calf and ankle pain lately.  He could have twisted his ankle with subsequent left lower ankle/heel contusion but does not recall any significant other trauma or falls.  ED Course: Upon presentation to the emergency room, blood pressure was 150/76 with otherwise normal vital signs.  Labs revealed a calcium of 8.4 and CBC showing hemoglobin of 7 hematocrit 22.2 with platelets of 228 and WBC of 5.2.  RBC indicis were within normal with MCV was 99.1 MCH 31.3 with MCHC of 31.5. EKG as reviewed by me : EKG showed normal sinus rhythm with rate of 88. Imaging: Chest x-ray showed slight increase in the left lung base atelectasis from recent CT. Chest CTA  revealed no evidence for PE.  It showed scattered mild subtle areas of increased sclerosis consistent with known metastatic disease within the thoracic spine and to lesser extent in the rib cage.  It showed slight increase in left lower lobe atelectasis when compared to previous CT.  It showed aortic atherosclerosis.Venous Doppler of the left lower extremity revealed small popliteal cyst with no evidence for DVT.  The patient was typed and crossmatched and will be transfused 2 units of packed red blood cells.  He will be admitted to an observation medically monitored bed for further evaluation and management.   PAST MEDICAL HISTORY:   Past Medical History:  Diagnosis Date   Adenomatous colon polyp    AR (allergic rhinitis)    BPH (benign prostatic hyperplasia)    ED (erectile dysfunction)    HLD (hyperlipidemia)    Prostatitis    RA (rheumatoid arthritis) ([place])     PAST SURGICAL HISTORY:   Past Surgical History:  Procedure Laterality Date   none      SOCIAL HISTORY:   Social History   Tobacco Use   Smoking status: Former   Smokeless tobacco: Never   Tobacco comments:    quit 30 years  Substance Use Topics   Alcohol use: No    Alcohol/week: 0.0 standard drinks    FAMILY HISTORY:   Family History  Problem Relation Age of Onset   Diabetes Mellitus II Father    Diabetes Mellitus II Sister  Kidney disease Neg Hx    Prostate cancer Neg Hx    Bladder Cancer Neg Hx     DRUG ALLERGIES:  No Known Allergies  REVIEW OF SYSTEMS:   ROS As per history of present illness. All pertinent systems were reviewed above. Constitutional, HEENT, cardiovascular, respiratory, GI, GU, musculoskeletal, neuro, psychiatric, endocrine, integumentary and hematologic systems were reviewed and are otherwise negative/unremarkable except for positive findings mentioned above in the HPI.   MEDICATIONS AT HOME:   Prior to Admission medications   Medication Sig Start Date End Date Taking?  Authorizing Provider  etanercept (ENBREL) 50 MG/ML injection  05/22/14   [provider]  etodolac (LODINE) 400 MG tablet Reported on 02/12/2016 04/13/15   [provider]  folic acid (FOLVITE) 1 MG tablet  05/22/14   [provider]  Glucosamine-Chondroitin 500-400 MG CAPS Take by mouth.    [provider]  HYDROcodone-acetaminophen (NORCO/VICODIN) 5-325 MG tablet Take 1 tablet by mouth 2 (two) times daily. 08/23/21   [provider]  methotrexate (RHEUMATREX) 2.5 MG tablet  04/06/15   [provider]  mirabegron ER (MYRBETRIQ) 50 MG TB24 tablet Take 1 tablet (50 mg total) by mouth daily. Patient not taking: Reported on 08/26/2021 04/07/21   Zara Council A, PA-C  Multiple Vitamins-Minerals (MULTIVITAMIN ADULT PO) Take by mouth daily.    [provider]  naproxen sodium (ALEVE) 220 MG tablet Take by mouth.    [provider]  omeprazole (PRILOSEC) 20 MG capsule Take by mouth.    [provider]  omeprazole (PRILOSEC) 20 MG capsule Take by mouth. 08/06/21   [provider]  sildenafil (VIAGRA) 100 MG tablet Take 1 tablet (100 mg total) by mouth daily as needed for erectile dysfunction. 03/17/21   Zara Council A, PA-C  tadalafil (CIALIS) 5 MG tablet Take one tablet daily for benign prostatic hyperplasia 03/17/21   Zara Council A, PA-C  tiZANidine (ZANAFLEX) 2 MG tablet Take 2 mg by mouth 3 (three) times daily. Patient not taking: Reported on 08/26/2021 10/07/19   [provider]      VITAL SIGNS:  Blood pressure (!) 146/69, pulse 86, temperature 98.8 F (37.1 C), temperature source Oral, resp. rate 16, height 6' (1.829 m), weight 65.8 kg, SpO2 100 %.  PHYSICAL EXAMINATION:  Physical Exam  GENERAL:  80 y.o.-year-old Caucasian male patient lying in the bed with no acute distress.  EYES: Pupils equal, round, reactive to light and accommodation. No scleral icterus. Extraocular muscles intact.   HEENT: Head atraumatic, normocephalic. Oropharynx and nasopharynx clear.  NECK:  Supple, no jugular venous distention. No thyroid enlargement, no tenderness.  LUNGS: Normal breath sounds bilaterally, no wheezing, rales,rhonchi or crepitation. No use of accessory muscles of respiration.  CARDIOVASCULAR: Regular rate and rhythm, S1, S2 normal. No murmurs, rubs, or gallops.  ABDOMEN: Soft, nondistended, nontender. Bowel sounds present. No organomegaly or mass.  EXTREMITIES: No pedal edema, cyanosis, or clubbing.  Left ankle mild swelling with medial heel contusion NEUROLOGIC: Cranial nerves II through XII are intact. Muscle strength 5/5 in all extremities. Sensation intact. Gait not checked.  PSYCHIATRIC: The patient is alert and oriented x 3.  Normal affect and good eye contact. SKIN: No obvious rash, lesion, or ulcer.   LABORATORY PANEL:   CBC Recent Labs  Lab 09/01/21 1941  WBC 5.2  HGB 7.0*  HCT 22.2*  PLT 228   ------------------------------------------------------------------------------------------------------------------  Chemistries  Recent Labs  Lab 08/30/21 1412 09/01/21 1941  NA 135 137  K 3.5 4.3  CL 101 105  CO2 27 25  GLUCOSE 147* 108*  BUN 11 12  CREATININE 0.81 0.72  CALCIUM 8.4* 8.4*  AST 105*  --   ALT 62*  --   ALKPHOS 254*  --   BILITOT 0.1*  --    ------------------------------------------------------------------------------------------------------------------  Cardiac Enzymes No results for input(s): TROPONINI in the last 168 hours. ------------------------------------------------------------------------------------------------------------------  RADIOLOGY:  DG Chest 2 View  Result Date: 09/01/2021 CLINICAL DATA:  Shortness of breath. EXAM: CHEST - 2 VIEW COMPARISON:  Chest CT 08/27/2021 FINDINGS: The heart is normal in size. Normal mediastinal contours. Slight increase in left lung base atelectasis from recent CT. No confluent consolidation.  No pleural effusion, pneumothorax, or pulmonary edema. Patient with known osseous metastatic disease, lesions not well seen on the current exam. IMPRESSION: Slight increase in left lung base atelectasis from recent CT. Electronically Signed   By: Keith Rake M.D.   On: 09/01/2021 19:58   DG Ankle Complete Left  Result Date: 09/02/2021 CLINICAL DATA:  Ankle injury 1 week ago with persistent pain, initial encounter EXAM: LEFT ANKLE COMPLETE - 3+ VIEW COMPARISON:  None. FINDINGS: Very mild lateral soft tissue swelling is noted. No acute fracture or dislocation is seen. IMPRESSION: Lateral soft tissue swelling without acute bony abnormality. Electronically Signed   By: Inez Catalina M.D.   On: 09/02/2021 00:51   US Venous Img Lower Unilateral Left  Result Date: 09/02/2021 CLINICAL DATA:  Left leg pain and swelling for several days EXAM: LEFT LOWER EXTREMITY VENOUS DOPPLER ULTRASOUND TECHNIQUE: Gray-scale sonography with graded compression, as well as color Doppler and duplex ultrasound were performed to evaluate the lower extremity deep venous systems from the level of the common femoral vein and including the common femoral, femoral, profunda femoral, popliteal and calf veins including the posterior tibial, peroneal and gastrocnemius veins when visible. The superficial great saphenous vein was also interrogated. Spectral Doppler was utilized to evaluate flow at rest and with distal augmentation maneuvers in the common femoral, femoral and popliteal veins. COMPARISON:  None. FINDINGS: Contralateral Common Femoral Vein: Respiratory phasicity is normal and symmetric with the symptomatic side. No evidence of thrombus. Normal compressibility. Common Femoral Vein: No evidence of thrombus. Normal compressibility, respiratory phasicity and response to augmentation. Saphenofemoral Junction: No evidence of thrombus. Normal compressibility and flow on color Doppler imaging. Profunda Femoral Vein: No evidence of  thrombus. Normal compressibility and flow on color Doppler imaging. Femoral Vein: No evidence of thrombus. Normal compressibility, respiratory phasicity and response to augmentation. Popliteal Vein: No evidence of thrombus. Normal compressibility, respiratory phasicity and response to augmentation. Calf Veins: No evidence of thrombus. Normal compressibility and flow on color Doppler imaging. Superficial Great Saphenous Vein: No evidence of thrombus. Normal compressibility. Venous Reflux:  None. Other Findings: Complex fluid collection is noted in the left popliteal fossa measuring 2.3 x 1.6 cm consistent with a popliteal cyst. IMPRESSION: No evidence of deep venous thrombosis. Small popliteal cyst. Electronically Signed   By: Inez Catalina M.D.   On: 09/02/2021 02:31      IMPRESSION AND PLAN:  Principal Problem:   Anemia  1.  Symptomatic anemia with history of bone metastasis of questionable primary. - The patient will be admitted to an observation medical monitored bed. - He was typed and crossmatched and will be transfused units of packed red blood cells. - We will follow posttransfusion H&H. - We will obtain a heme-onc follow-up consult. - The patient's stool Hemoccult came back negative. -  We will obtain further anemia work-up. - Pain management will be provided. - Hematology and oncology consult will be obtained. - I notified Dr. Tasia Catchings about the patient.    2.  GERD. - We will continue PPI therapy.  3.  Osteoarthritis. - I will hold off NSAIDs for now.  4.  Rheumatoid arthritis. - The patient is on Enbrel.  5.  Overactive bladder. - We will continue her Myrbetriq or alternative..  DVT prophylaxis: Lovenox. Code Status: full code. Family Communication:  The plan of care was discussed in details with the patient (and family). I answered all questions. The patient agreed to proceed with the above mentioned plan. Further management will depend upon hospital course. Disposition Plan:  Back to previous home environment Consults called: Heme-onc consult. All the records are reviewed and case discussed with ED provider.  Status is: Observation  Remains inpatient appropriate because:Ongoing diagnostic testing needed not appropriate for outpatient work up, Unsafe d/c plan, IV treatments appropriate due to intensity of illness or inability to take PO, and Inpatient level of care appropriate due to severity of illness   Dispo: The patient is from: Home              Anticipated d/c is to: Home              Patient currently is not medically stable to d/c.              Difficult to place patient: No  TOTAL TIME TAKING CARE OF THIS PATIENT: 55 minutes.     Christel Mormon M.D on 09/02/2021 at 2:41 AM  [facility]   From 7 PM-7 AM, contact night-coverage [email or web address]  CC: Primary care physician; Bella Kennedy, MD

## 2021-09-02 NOTE — ED Notes (Signed)
Diet order was changed to clear liquids. Called dietary to have breakfast tray sent.

## 2021-09-02 NOTE — ED Provider Notes (Signed)
[facility] Emergency Department Provider Note  ____________________________________________   Event Date/Time   First MD Initiated Contact with Patient 09/02/21 0022     (approximate)  I have reviewed the triage vital signs and the nursing notes.   HISTORY  Chief Complaint Anemia and Blood Transfusion    HPI TOY EISEMANN is a 80 y.o. 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  Found to have a hemoglobin of 7.1 by his oncologist Dr. Tasia Catchings today.  His last hemoglobin was 13.2 with hematocrit of 37.9 on 08/06/2021.  He denies any bloody stools or melena.  No abdominal pain.  States he has had shortness of breath, dizziness, generalized weakness but no chest pain or chest discomfort.  Patient is not on any blood thinners.  He has never had to have a blood transfusion before.  Patient reports about a week ago he twisted his left ankle when he was stepping off of a curb.  It is bruised and swollen and painful.  He has not seen anybody or had an x-ray.  Now starting to have some calf pain as well.  Denies history of PE or DVT.        Past Medical History:  Diagnosis Date   Adenomatous colon polyp    AR (allergic rhinitis)    BPH (benign prostatic hyperplasia)    ED (erectile dysfunction)    HLD (hyperlipidemia)    Prostatitis    RA (rheumatoid arthritis) ([place])     Patient Active Problem List   Diagnosis Date Noted   Anemia 09/02/2021   Bone lesion 08/26/2021   Barrett's esophagus 05/21/2018   H/O rheumatoid arthritis 05/21/2018   Hx of adenomatous colonic polyps 05/21/2018   Rheumatoid arthritis involving multiple sites with positive rheumatoid factor ([place]) 02/07/2017   Prostatitis 02/18/2016   BPH with obstruction/lower urinary tract symptoms 02/18/2016    Past Surgical History:   Procedure Laterality Date   none      Prior to Admission medications   Medication Sig Start Date End Date Taking? Authorizing Provider  etanercept (ENBREL) 50 MG/ML injection  05/22/14   [provider]  etodolac (LODINE) 400 MG tablet Reported on 02/12/2016 04/13/15   [provider]  folic acid (FOLVITE) 1 MG tablet  05/22/14   [provider]  Glucosamine-Chondroitin 500-400 MG CAPS Take by mouth.    [provider]  HYDROcodone-acetaminophen (NORCO/VICODIN) 5-325 MG tablet Take 1 tablet by mouth 2 (two) times daily. 08/23/21   [provider]  methotrexate (RHEUMATREX) 2.5 MG tablet  04/06/15   [provider]  mirabegron ER (MYRBETRIQ) 50 MG TB24 tablet Take 1 tablet (50 mg total) by mouth daily. Patient not taking: Reported on 08/26/2021 04/07/21   Zara Council A, PA-C  Multiple Vitamins-Minerals (MULTIVITAMIN ADULT PO) Take by mouth daily.    [provider]  naproxen sodium (ALEVE) 220 MG tablet Take by mouth.    [provider]  omeprazole (PRILOSEC) 20 MG capsule Take by mouth.    [provider]  omeprazole (PRILOSEC) 20 MG capsule Take by mouth. 08/06/21   [provider]  sildenafil (VIAGRA) 100 MG tablet Take 1 tablet (100 mg total) by mouth daily as needed for erectile dysfunction. 03/17/21   Zara Council A, PA-C  tadalafil (CIALIS) 5 MG tablet Take one tablet daily for benign prostatic hyperplasia 03/17/21  Zara Council A, PA-C  tiZANidine (ZANAFLEX) 2 MG tablet Take 2 mg by mouth 3 (three) times daily. Patient not taking: Reported on 08/26/2021 10/07/19   [provider]    Allergies Patient has no known allergies.  Family History  Problem Relation Age of Onset   Diabetes Mellitus II Father    Diabetes Mellitus II Sister    Kidney disease Neg Hx    Prostate cancer Neg Hx    Bladder Cancer Neg Hx     Social History Social History   Tobacco Use   Smoking status:  Former   Smokeless tobacco: Never   Tobacco comments:    quit 30 years  Substance Use Topics   Alcohol use: No    Alcohol/week: 0.0 standard drinks   Drug use: No    Review of Systems Constitutional: No fever. Eyes: No visual changes. ENT: No sore throat. Cardiovascular: Denies chest pain. Respiratory: + shortness of breath. Gastrointestinal: No nausea, vomiting, diarrhea. Genitourinary: Negative for dysuria. Musculoskeletal: Negative for back pain. Skin: Negative for rash. Neurological: Negative for focal weakness or numbness.  ____________________________________________   PHYSICAL EXAM:  VITAL SIGNS: ED Triage Vitals [09/01/21 1929]  Enc Vitals Group     BP (!) 150/76     Pulse Rate 87     Resp 16     Temp 98.8 F (37.1 C)     Temp Source Oral     SpO2 99 %     Weight 145 lb (65.8 kg)     Height 6' (1.829 m)     Head Circumference      Peak Flow      Pain Score 5     Pain Loc      Pain Edu?      Excl. in [place]?    CONSTITUTIONAL: Alert and oriented and responds appropriately to questions.  Elderly, in no distress HEAD: Normocephalic EYES: Conjunctivae clear, pupils appear equal, EOM appear intact ENT: normal nose; moist mucous membranes NECK: Supple, normal ROM CARD: RRR; S1 and S2 appreciated; no murmurs, no clicks, no rubs, no gallops RESP: Normal chest excursion without splinting or tachypnea; breath sounds clear and equal bilaterally; no wheezes, no rhonchi, no rales, no hypoxia or respiratory distress, speaking full sentences ABD/GI: Normal bowel sounds; non-distended; soft, non-tender, no rebound, no guarding, no peritoneal signs, no hepatosplenomegaly RECTAL:  Normal rectal tone, no gross blood or melena, guaiac NEGATIVE, no hemorrhoids appreciated, nontender rectal exam, no fecal impaction. Chaperone present. BACK: The back appears normal EXT: Left ankle is tender to palpation with soft tissue swelling and ecchymosis to the most medial aspect.  No  tenderness over the left fibular head.  2+ left DP pulse.  He has some left calf tenderness but no significant calf swelling noted.  Compartments are soft.  Normal ROM in all joints; no other deformity noted, no edema; no cyanosis SKIN: Normal color for age and race; warm; no rash on exposed skin NEURO: Moves all extremities equally PSYCH: The patient's mood and manner are appropriate.  ____________________________________________   LABS (all labs ordered are listed, but only abnormal results are displayed)  Labs Reviewed  BASIC METABOLIC PANEL - Abnormal; Notable for the following components:      Result Value   Glucose, Bld 108 (*)    Calcium 8.4 (*)    All other components within normal limits  CBC - Abnormal; Notable for the following components:   RBC 2.24 (*)    Hemoglobin 7.0 (*)  HCT 22.2 (*)    RDW 15.8 (*)    nRBC 0.8 (*)    All other components within normal limits  IRON AND TIBC - Abnormal; Notable for the following components:   Iron 37 (*)    TIBC 463 (*)    Saturation Ratios 8 (*)    All other components within normal limits  RETICULOCYTES - Abnormal; Notable for the following components:   Retic Ct Pct 7.5 (*)    RBC. 2.20 (*)    Immature Retic Fract 30.6 (*)    All other components within normal limits  PROTIME-INR - Abnormal; Notable for the following components:   Prothrombin Time 15.6 (*)    All other components within normal limits  LACTATE DEHYDROGENASE - Abnormal; Notable for the following components:   LDH 469 (*)    All other components within normal limits  RESP PANEL BY RT-PCR (FLU A&B, COVID) ARPGX2  FOLATE  FERRITIN  VITAMIN B12  HEMOGLOBIN AND HEMATOCRIT, BLOOD  HEMOGLOBIN AND HEMATOCRIT, BLOOD  HAPTOGLOBIN  BASIC METABOLIC PANEL  CBC  TYPE AND SCREEN  PREPARE RBC (CROSSMATCH)  ABO/RH  TROPONIN I (HIGH SENSITIVITY)  TROPONIN I (HIGH SENSITIVITY)   ____________________________________________  EKG   Date: 09/01/2021 19:36   Rate: 88  Rhythm: normal sinus rhythm  QRS Axis: normal  Intervals: normal  ST/T Wave abnormalities: normal  Conduction Disutrbances: none  Narrative Interpretation: unremarkable    ____________________________________________  RADIOLOGY I, Hudsyn Champine, personally viewed and evaluated these images (plain radiographs) as part of my medical decision making, as well as reviewing the written report by the radiologist.  ED MD interpretation: No PE or DVT seen on imaging.  Chest x-ray shows no acute abnormality.  Left ankle x-ray shows no fracture.  Official radiology report(s): DG Chest 2 View  Result Date: 09/01/2021 CLINICAL DATA:  Shortness of breath. EXAM: CHEST - 2 VIEW COMPARISON:  Chest CT 08/27/2021 FINDINGS: The heart is normal in size. Normal mediastinal contours. Slight increase in left lung base atelectasis from recent CT. No confluent consolidation. No pleural effusion, pneumothorax, or pulmonary edema. Patient with known osseous metastatic disease, lesions not well seen on the current exam. IMPRESSION: Slight increase in left lung base atelectasis from recent CT. Electronically Signed   By: Keith Rake M.D.   On: 09/01/2021 19:58   DG Ankle Complete Left  Result Date: 09/02/2021 CLINICAL DATA:  Ankle injury 1 week ago with persistent pain, initial encounter EXAM: LEFT ANKLE COMPLETE - 3+ VIEW COMPARISON:  None. FINDINGS: Very mild lateral soft tissue swelling is noted. No acute fracture or dislocation is seen. IMPRESSION: Lateral soft tissue swelling without acute bony abnormality. Electronically Signed   By: Inez Catalina M.D.   On: 09/02/2021 00:51   CT Angio Chest PE W and/or Wo Contrast  Result Date: 09/02/2021 CLINICAL DATA:  Shortness of breath EXAM: CT ANGIOGRAPHY CHEST WITH CONTRAST TECHNIQUE: Multidetector CT imaging of the chest was performed using the standard protocol during bolus administration of intravenous contrast. Multiplanar CT image reconstructions and  MIPs were obtained to evaluate the vascular anatomy. CONTRAST:  49mL OMNIPAQUE IOHEXOL 350 MG/ML SOLN COMPARISON:  Chest x-ray from the previous day, CT from 08/27/2021. FINDINGS: Cardiovascular: Thoracic aorta demonstrates atherosclerotic calcifications without aneurysmal dilatation or dissection. No cardiac enlargement is noted. Coronary calcifications are seen. Pulmonary artery shows a normal branching pattern. No filling defect to suggest pulmonary embolism is noted. Mediastinum/Nodes: Thoracic inlet is within normal limits. No sizable hilar or mediastinal adenopathy is noted.  The esophagus is air-filled and patulous. Lungs/Pleura: Lungs are well aerated bilaterally. Left lower lobe atelectasis is seen similar to that noted on recent chest x-ray. No sizable effusion is noted. No focal infiltrate on the right is noted. No parenchymal nodules are seen. Upper Abdomen: Visualized upper abdomen is within normal limits. Musculoskeletal: Subtle areas of increased sclerosis are noted throughout the thoracic spine consistent with the known history of metastatic disease. Similar findings are noted within the ribcage. Review of the MIP images confirms the above findings. IMPRESSION: No evidence of pulmonary emboli. Scattered mild subtle areas of increased sclerosis consistent with known metastatic disease within the thoracic spine and to a lesser degree in the ribcage. Slight increase in left lower lobe atelectasis when compare with the prior CT similar to that seen on recent chest x-ray. Aortic Atherosclerosis (ICD10-I70.0). Electronically Signed   By: Inez Catalina M.D.   On: 09/02/2021 03:01   US Venous Img Lower Unilateral Left  Result Date: 09/02/2021 CLINICAL DATA:  Left leg pain and swelling for several days EXAM: LEFT LOWER EXTREMITY VENOUS DOPPLER ULTRASOUND TECHNIQUE: Gray-scale sonography with graded compression, as well as color Doppler and duplex ultrasound were performed to evaluate the lower extremity  deep venous systems from the level of the common femoral vein and including the common femoral, femoral, profunda femoral, popliteal and calf veins including the posterior tibial, peroneal and gastrocnemius veins when visible. The superficial great saphenous vein was also interrogated. Spectral Doppler was utilized to evaluate flow at rest and with distal augmentation maneuvers in the common femoral, femoral and popliteal veins. COMPARISON:  None. FINDINGS: Contralateral Common Femoral Vein: Respiratory phasicity is normal and symmetric with the symptomatic side. No evidence of thrombus. Normal compressibility. Common Femoral Vein: No evidence of thrombus. Normal compressibility, respiratory phasicity and response to augmentation. Saphenofemoral Junction: No evidence of thrombus. Normal compressibility and flow on color Doppler imaging. Profunda Femoral Vein: No evidence of thrombus. Normal compressibility and flow on color Doppler imaging. Femoral Vein: No evidence of thrombus. Normal compressibility, respiratory phasicity and response to augmentation. Popliteal Vein: No evidence of thrombus. Normal compressibility, respiratory phasicity and response to augmentation. Calf Veins: No evidence of thrombus. Normal compressibility and flow on color Doppler imaging. Superficial Great Saphenous Vein: No evidence of thrombus. Normal compressibility. Venous Reflux:  None. Other Findings: Complex fluid collection is noted in the left popliteal fossa measuring 2.3 x 1.6 cm consistent with a popliteal cyst. IMPRESSION: No evidence of deep venous thrombosis. Small popliteal cyst. Electronically Signed   By: Inez Catalina M.D.   On: 09/02/2021 02:31    ____________________________________________   PROCEDURES  Procedure(s) performed (including Critical Care):  Procedures  CRITICAL CARE Performed by: Cyril Mourning Kasara Schomer   Total critical care time: 45 minutes  Critical care time was exclusive of separately billable  procedures and treating other patients.  Critical care was necessary to treat or prevent imminent or life-threatening deterioration.  Critical care was time spent personally by me on the following activities: development of treatment plan with patient and/or surrogate as well as nursing, discussions with consultants, evaluation of patient's response to treatment, examination of patient, obtaining history from patient or surrogate, ordering and performing treatments and interventions, ordering and review of laboratory studies, ordering and review of radiographic studies, pulse oximetry and re-evaluation of patient's condition.  ____________________________________________   INITIAL IMPRESSION / ASSESSMENT AND PLAN / ED COURSE  As part of my medical decision making, I reviewed the following data within the [place]  History obtained from family, Nursing notes reviewed and incorporated, Labs reviewed , EKG interpreted , Old EKG reviewed, Old chart reviewed, Radiograph reviewed , Discussed with admitting physician , CTs and ultrasound reviewed, and Notes from prior ED visits         Patient here with complaints of symptomatic anemia.  Hemoglobin dropped from 13.2 about a month ago now to 7.0.  Will perform rectal exam.  He has not on any blood thinners.  He denies any other source of bleeding.  He is hemodynamically stable at this time.  Has not yet started chemotherapy or radiation for cancer treatment as I do not yet know his primary source.  He is on Enbrel for rheumatoid arthritis but no longer on methotrexate.  He will need admission for anemia work-up and transfusion.  Patient also complaining of ankle pain after he twisted it about a week ago.  Will obtain x-rays of the ankle.  Also having some calf tenderness.  Discussed that he is high risk for DVT.  Will obtain venous Doppler.  Also given he has having shortness of breath although this is likely due to anemia, will obtain  CTA of the chest.  ED PROGRESS  Patient's x-ray of the left ankle shows no acute abnormality when reviewed by myself and radiology.  Ultrasound of the left lower extremity shows no DVT.   2:40 AM  Discussed patient's case with hospitalist, Dr. Sidney Ace.  I have recommended admission and patient (and family if present) agree with this plan. Admitting physician will place admission orders.   I reviewed all nursing notes, vitals, pertinent previous records and reviewed/interpreted all EKGs, lab and urine results, imaging (as available).    CTA of the chest shows no PE.  Patient remains hemodynamically stable and receiving transfusion. ____________________________________________   FINAL CLINICAL IMPRESSION(S) / ED DIAGNOSES  Final diagnoses:  Symptomatic anemia     ED Discharge Orders     None       *Please note:  ABDALRAHMAN CLEMENTSON was evaluated in Emergency Department on 09/02/2021 for the symptoms described in the history of present illness. He was evaluated in the context of the global COVID-19 pandemic, which necessitated consideration that the patient might be at risk for infection with the SARS-CoV-2 virus that causes COVID-19. Institutional protocols and algorithms that pertain to the evaluation of patients at risk for COVID-19 are in a state of rapid change based on information released by regulatory bodies including the CDC and federal and state organizations. These policies and algorithms were followed during the patient's care in the ED.  Some ED evaluations and interventions may be delayed as a result of limited staffing during and the pandemic.*   Note:  This document was prepared using Dragon voice recognition software and may include unintentional dictation errors.    Chalee Hirota, Delice Bison, DO 09/02/21 [phone]

## 2021-09-02 NOTE — Progress Notes (Signed)
[facility] PLAN OF CARE NOTE Patient: Stuart Mcneil ZOX:[id number]   PCP: Bella Kennedy, MD DOB: Nov 12, 1940   DOA: 09/02/2021   DOS: 09/02/2021    Patient was admitted by my colleague earlier on 09/02/2021. I have reviewed the H&P as well as assessment and plan and agree with the same. Important changes in the plan are listed below.  Plan of care: Principal Problem:   Anemia Active Problems:   Symptomatic anemia   Esophageal dysphagia   Metastatic bone tumor (HCC) GI consulted.  Scheduled for EGD tomorrow. IV Venofer ordered by hematology.   Author: Berle Mull, MD [facility] 09/02/2021 8:22 PM   If 7PM-7AM, please contact night-coverage at [email or web address]

## 2021-09-02 NOTE — Consult Note (Signed)
GI Inpatient Consult Note  Reason for Consult: Iron-deficiency anemia, esophageal dysphagia, unintentional weight loss   Attending Requesting Consult: Dr. Earlie Server  History of Present Illness: Stuart Mcneil is a 80 y.o. male seen for evaluation of iron-deficiency anemia, esophageal dysphagia, and unintentional weight loss at the request of Dr. Earlie Server. 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 He presented to the [facility] ED yesterday evening at the request of Dr. Tasia Catchings for precipitous drop in his hemoglobin. He had blood work showing hemoglobin 7.1 which was decreased from 13.2 less than one month ago. He reports over the past three weeks he has been experiencing progressive weakness, fatigue, dizziness, and dyspnea on exertion. He denies any overt hematochezia or melena. There have been no complaints of hematemesis, epistaxis, or overt bleeding from any openings. No complaints of abdominal pain. Upon presentation to the ED last night, BP was 150/76 and otherwise normal vital signs. Labs showing hemoglobin 7.0, hematocrit 22.2, platelets 228K. He had chest x-ray, chest CTA, and lower extremity dopplers which were negative. Per ED physician, rectal exam performed showed brown stool in rectal vault which was guaiac negative. He was transfused 1 unit pRBCs and admitted to the hospital. GI was consulted in context of symptomatic anemia.  Patient seen and examined this morning resting comfortably in ED stretcher. No family at bedside. He reports he lives alone as his wife is in SNF. He has a local son and then another son in [place] who is on his way to [place]. He feels better after the blood transfusion. The last month has been a lot for him to process due to multiple imaging modalities, new office visits, and labs. He is anxious about where the  primary source of the cancer is. He denies any recent changes in his bowel habits. He reports he typically has a BM daily to every other day. Bms have been brown in color. He denies any overt bloody or melanotic stools. He has no complaints of abdominal pain or abdominal cramping. His main pain has been in shoulders, back, and ankle which the pain medication has been helpful. He reports a 10-lb weight loss over the last month. Food doesn't taste the same. He also endorses esophageal dysphagia to solid foods localized to the upper sternum where he feels like food is hanging here and causing a lot of pressure. Liquids don't seem to cause this issue. There has been no regurgitation of the food bolus. He denies any nausea, vomiting, odynophagia, early satiety, or epigastric abdominal pain. He does have hx of Barrett's esophagus where he previously followed with Dr. Vira Agar at [facility]. Last EGD and colonoscopy 12/2017 showed short-segment Barrett's esophagus without dysplasia, hyperplastic/inflammatory polyp in stomach, H pylori negative gastritis, and subcentimeter tubular adenoma removed from splenic flexure. He has not felt any refractory GERD symptoms. He denies any known family history of GI malignancies.  Past Medical History:  Past Medical History:  Diagnosis Date   Adenomatous colon polyp    AR (allergic rhinitis)    BPH (benign prostatic hyperplasia)    ED (erectile dysfunction)    HLD (hyperlipidemia)    Prostatitis    RA (rheumatoid arthritis) ([place])     Problem List: Patient Active Problem List   Diagnosis Date Noted   Anemia 09/02/2021   Bone lesion 08/26/2021   Barrett's esophagus 05/21/2018  H/O rheumatoid arthritis 05/21/2018   Hx of adenomatous colonic polyps 05/21/2018   Rheumatoid arthritis involving multiple sites with positive rheumatoid factor ([place]) 02/07/2017   Prostatitis 02/18/2016   BPH with obstruction/lower urinary tract symptoms 02/18/2016    Past Surgical  History: Past Surgical History:  Procedure Laterality Date   none      Allergies: No Known Allergies  Home Medications: (Not in a hospital admission)  Home medication reconciliation was completed with the patient.   Scheduled Inpatient Medications:    folic acid  1 mg Oral Daily   mirabegron ER  50 mg Oral Daily   multivitamin with minerals   Oral Daily   pantoprazole  40 mg Oral BID    Continuous Inpatient Infusions:    sodium chloride     sodium chloride 75 mL/hr at 09/02/21 0931    PRN Inpatient Medications:  acetaminophen **OR** acetaminophen, HYDROcodone-acetaminophen, magnesium hydroxide, morphine injection, ondansetron **OR** ondansetron (ZOFRAN) IV, traZODone  Family History: family history includes Diabetes Mellitus II in his father and sister.  The patient's family history is negative for inflammatory bowel disorders, GI malignancy, or solid organ transplantation.  Social History:   reports that he has quit smoking. He has never used smokeless tobacco. He reports that he does not drink alcohol and does not use drugs. The patient denies ETOH, tobacco, or drug use.   Review of Systems: Constitutional: + weight loss Eyes: No changes in vision. ENT: No oral lesions, sore throat.  GI: see HPI.  Heme/Lymph: No easy bruising.  CV: No chest pain.  GU: No hematuria.  Integumentary: No rashes.  Neuro: No headaches.  Psych: No depression/anxiety.  Endocrine: No heat/cold intolerance.  Allergic/Immunologic: No urticaria.  Resp: No cough, SOB.  Musculoskeletal: +shoulder, back, and ankle pain.    Physical Examination: BP (!) 144/70   Pulse 80   Temp 98.1 F (36.7 C) (Oral)   Resp 18   Ht 6' (1.829 m)   Wt 65.8 kg   SpO2 97%   BMI 19.67 kg/m  Gen: NAD, alert and oriented x 4 HEENT: PEERLA, EOMI, Neck: supple, no JVD or thyromegaly Chest: CTA bilaterally, no wheezes, crackles, or other adventitious sounds CV: RRR, no m/g/c/r Abd: soft, NT, ND, +BS in all  four quadrants; no HSM, guarding, ridigity, or rebound tenderness Ext: no edema, well perfused with 2+ pulses, Skin: no rash or lesions noted Lymph: no LAD  Data: Lab Results  Component Value Date   WBC 4.7 09/02/2021   HGB 8.3 (L) 09/02/2021   HCT 24.5 (L) 09/02/2021   MCV 95.3 09/02/2021   PLT 212 09/02/2021   Recent Labs  Lab 08/30/21 1412 09/01/21 1941 09/02/21 0614  HGB 7.1* 7.0* 8.3*   Lab Results  Component Value Date   NA 138 09/02/2021   K 3.9 09/02/2021   CL 103 09/02/2021   CO2 25 09/02/2021   BUN 11 09/02/2021   CREATININE 0.70 09/02/2021   Lab Results  Component Value Date   ALT 62 (H) 08/30/2021   AST 105 (H) 08/30/2021   ALKPHOS 254 (H) 08/30/2021   BILITOT 0.1 (L) 08/30/2021   Recent Labs  Lab 09/02/21 0037  INR 1.2   CT chest/abd/pelvis with contrast 08/27/2021: IMPRESSION: 1. 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 No overtly lytic lesions identified. 2. Left eccentric epidural soft tissue at T10 is only very subtly  appreciated by CT. 3. No evidence of primary malignancy, lymphadenopathy or other soft tissue metastatic disease in the chest, abdomen, or pelvis. 4. Prostatomegaly. 5. Coronary artery disease.   Aortic Atherosclerosis (ICD10-I70.0).  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 A 6-gram drop in hemoglobin over the past 3-weeks from 13.2-->7.0 is concerning for chronic GI blood loss. No overt hematochezia or melena and DRE performed showed brown stool which was guaiac negative. He has been appropriately transfused 1 unit pRBCs with hemoglobin up to 8.3. His solid food esophageal dysphagia symptoms and 10-lb unintentional weight loss are concerning for UGI pathology, ddx including esophageal or gastric adenocarcinoma, Barrett's with high-grade dysplasia, malignant esophageal stricture, Schatzki ring, infectious esophagitis, etc. Work-up to date with blood work and cross-sectional imaging has not revealed a primary source of his bony metastases. Given his 6-gram drop in hemoglobin, esophageal dysphagia, and unintentional weight loss, I recommend bidirectional endoscopy for further diagnostic work-up. We discussed procedure details and indications in ED room today and patient consents to proceed. We will plan for EGD/colonoscopy tomorrow with Dr. Alice Reichert and further recommendations can be provided after (+/- VCE, etc).    Recommendations:  - Continue to monitor H&H closely and transfuse for Hgb <7.0.  - Monitor for signs and symptoms of gastrointestinal blood loss - Continue PPI for gastric protection - Clear liquid diet today. NPO after midnight. Bowel prep orders in.  - EGD and colonoscopy tomorrow with Dr. Alice Reichert - Following along  I reviewed the risks (including bleeding, perforation, infection, anesthesia complications, cardiac/respiratory complications), benefits and alternatives of EGD and colonsocopy. Patient consents to proceed.    Thank you for the consult. Please call with questions or concerns.  Reeves Forth [facility] [phone] (Cell)

## 2021-09-02 NOTE — Telephone Encounter (Signed)
Dr. Tasia Catchings called pt with results.

## 2021-09-02 NOTE — ED Notes (Signed)
Clear liquid breakfast tray brought to pt, placed at bedside.

## 2021-09-02 NOTE — ED Notes (Signed)
Called dietary. They will bring clear liquid breakfast tray for pt.

## 2021-09-02 NOTE — ED Notes (Signed)
Sent pharmacy message re: verify vicoden for pt.

## 2021-09-02 NOTE — ED Notes (Signed)
Informed RN bed assigned 

## 2021-09-02 NOTE — H&P (View-Only) (Signed)
GI Inpatient Consult Note  Reason for Consult: Iron-deficiency anemia, esophageal dysphagia, unintentional weight loss   Attending Requesting Consult: Dr. Earlie Server  History of Present Illness: Stuart Mcneil is a 80 y.o. male seen for evaluation of iron-deficiency anemia, esophageal dysphagia, and unintentional weight loss at the request of Dr. Earlie Server. 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 He presented to the [facility] ED yesterday evening at the request of Dr. Tasia Catchings for precipitous drop in his hemoglobin. He had blood work showing hemoglobin 7.1 which was decreased from 13.2 less than one month ago. He reports over the past three weeks he has been experiencing progressive weakness, fatigue, dizziness, and dyspnea on exertion. He denies any overt hematochezia or melena. There have been no complaints of hematemesis, epistaxis, or overt bleeding from any openings. No complaints of abdominal pain. Upon presentation to the ED last night, BP was 150/76 and otherwise normal vital signs. Labs showing hemoglobin 7.0, hematocrit 22.2, platelets 228K. He had chest x-ray, chest CTA, and lower extremity dopplers which were negative. Per ED physician, rectal exam performed showed brown stool in rectal vault which was guaiac negative. He was transfused 1 unit pRBCs and admitted to the hospital. GI was consulted in context of symptomatic anemia.  Patient seen and examined this morning resting comfortably in ED stretcher. No family at bedside. He reports he lives alone as his wife is in SNF. He has a local son and then another son in [place] who is on his way to [place]. He feels better after the blood transfusion. The last month has been a lot for him to process due to multiple imaging modalities, new office visits, and labs. He is anxious about where the  primary source of the cancer is. He denies any recent changes in his bowel habits. He reports he typically has a BM daily to every other day. Bms have been brown in color. He denies any overt bloody or melanotic stools. He has no complaints of abdominal pain or abdominal cramping. His main pain has been in shoulders, back, and ankle which the pain medication has been helpful. He reports a 10-lb weight loss over the last month. Food doesn't taste the same. He also endorses esophageal dysphagia to solid foods localized to the upper sternum where he feels like food is hanging here and causing a lot of pressure. Liquids don't seem to cause this issue. There has been no regurgitation of the food bolus. He denies any nausea, vomiting, odynophagia, early satiety, or epigastric abdominal pain. He does have hx of Barrett's esophagus where he previously followed with Dr. Vira Agar at [facility]. Last EGD and colonoscopy 12/2017 showed short-segment Barrett's esophagus without dysplasia, hyperplastic/inflammatory polyp in stomach, H pylori negative gastritis, and subcentimeter tubular adenoma removed from splenic flexure. He has not felt any refractory GERD symptoms. He denies any known family history of GI malignancies.  Past Medical History:  Past Medical History:  Diagnosis Date   Adenomatous colon polyp    AR (allergic rhinitis)    BPH (benign prostatic hyperplasia)    ED (erectile dysfunction)    HLD (hyperlipidemia)    Prostatitis    RA (rheumatoid arthritis) ([place])     Problem List: Patient Active Problem List   Diagnosis Date Noted   Anemia 09/02/2021   Bone lesion 08/26/2021   Barrett's esophagus 05/21/2018  H/O rheumatoid arthritis 05/21/2018   Hx of adenomatous colonic polyps 05/21/2018   Rheumatoid arthritis involving multiple sites with positive rheumatoid factor ([place]) 02/07/2017   Prostatitis 02/18/2016   BPH with obstruction/lower urinary tract symptoms 02/18/2016    Past Surgical  History: Past Surgical History:  Procedure Laterality Date   none      Allergies: No Known Allergies  Home Medications: (Not in a hospital admission)  Home medication reconciliation was completed with the patient.   Scheduled Inpatient Medications:    folic acid  1 mg Oral Daily   mirabegron ER  50 mg Oral Daily   multivitamin with minerals   Oral Daily   pantoprazole  40 mg Oral BID    Continuous Inpatient Infusions:    sodium chloride     sodium chloride 75 mL/hr at 09/02/21 0931    PRN Inpatient Medications:  acetaminophen **OR** acetaminophen, HYDROcodone-acetaminophen, magnesium hydroxide, morphine injection, ondansetron **OR** ondansetron (ZOFRAN) IV, traZODone  Family History: family history includes Diabetes Mellitus II in his father and sister.  The patient's family history is negative for inflammatory bowel disorders, GI malignancy, or solid organ transplantation.  Social History:   reports that he has quit smoking. He has never used smokeless tobacco. He reports that he does not drink alcohol and does not use drugs. The patient denies ETOH, tobacco, or drug use.   Review of Systems: Constitutional: + weight loss Eyes: No changes in vision. ENT: No oral lesions, sore throat.  GI: see HPI.  Heme/Lymph: No easy bruising.  CV: No chest pain.  GU: No hematuria.  Integumentary: No rashes.  Neuro: No headaches.  Psych: No depression/anxiety.  Endocrine: No heat/cold intolerance.  Allergic/Immunologic: No urticaria.  Resp: No cough, SOB.  Musculoskeletal: +shoulder, back, and ankle pain.    Physical Examination: BP (!) 144/70   Pulse 80   Temp 98.1 F (36.7 C) (Oral)   Resp 18   Ht 6' (1.829 m)   Wt 65.8 kg   SpO2 97%   BMI 19.67 kg/m  Gen: NAD, alert and oriented x 4 HEENT: PEERLA, EOMI, Neck: supple, no JVD or thyromegaly Chest: CTA bilaterally, no wheezes, crackles, or other adventitious sounds CV: RRR, no m/g/c/r Abd: soft, NT, ND, +BS in all  four quadrants; no HSM, guarding, ridigity, or rebound tenderness Ext: no edema, well perfused with 2+ pulses, Skin: no rash or lesions noted Lymph: no LAD  Data: Lab Results  Component Value Date   WBC 4.7 09/02/2021   HGB 8.3 (L) 09/02/2021   HCT 24.5 (L) 09/02/2021   MCV 95.3 09/02/2021   PLT 212 09/02/2021   Recent Labs  Lab 08/30/21 1412 09/01/21 1941 09/02/21 0614  HGB 7.1* 7.0* 8.3*   Lab Results  Component Value Date   NA 138 09/02/2021   K 3.9 09/02/2021   CL 103 09/02/2021   CO2 25 09/02/2021   BUN 11 09/02/2021   CREATININE 0.70 09/02/2021   Lab Results  Component Value Date   ALT 62 (H) 08/30/2021   AST 105 (H) 08/30/2021   ALKPHOS 254 (H) 08/30/2021   BILITOT 0.1 (L) 08/30/2021   Recent Labs  Lab 09/02/21 0037  INR 1.2   CT chest/abd/pelvis with contrast 08/27/2021: IMPRESSION: 1. 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 No overtly lytic lesions identified. 2. Left eccentric epidural soft tissue at T10 is only very subtly  appreciated by CT. 3. No evidence of primary malignancy, lymphadenopathy or other soft tissue metastatic disease in the chest, abdomen, or pelvis. 4. Prostatomegaly. 5. Coronary artery disease.   Aortic Atherosclerosis (ICD10-I70.0).  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 A 6-gram drop in hemoglobin over the past 3-weeks from 13.2-->7.0 is concerning for chronic GI blood loss. No overt hematochezia or melena and DRE performed showed brown stool which was guaiac negative. He has been appropriately transfused 1 unit pRBCs with hemoglobin up to 8.3. His solid food esophageal dysphagia symptoms and 10-lb unintentional weight loss are concerning for UGI pathology, ddx including esophageal or gastric adenocarcinoma, Barrett's with high-grade dysplasia, malignant esophageal stricture, Schatzki ring, infectious esophagitis, etc. Work-up to date with blood work and cross-sectional imaging has not revealed a primary source of his bony metastases. Given his 6-gram drop in hemoglobin, esophageal dysphagia, and unintentional weight loss, I recommend bidirectional endoscopy for further diagnostic work-up. We discussed procedure details and indications in ED room today and patient consents to proceed. We will plan for EGD/colonoscopy tomorrow with Dr. Alice Reichert and further recommendations can be provided after (+/- VCE, etc).    Recommendations:  - Continue to monitor H&H closely and transfuse for Hgb <7.0.  - Monitor for signs and symptoms of gastrointestinal blood loss - Continue PPI for gastric protection - Clear liquid diet today. NPO after midnight. Bowel prep orders in.  - EGD and colonoscopy tomorrow with Dr. Alice Reichert - Following along  I reviewed the risks (including bleeding, perforation, infection, anesthesia complications, cardiac/respiratory complications), benefits and alternatives of EGD and colonsocopy. Patient consents to proceed.    Thank you for the consult. Please call with questions or concerns.  [facility] Forth [facility] [phone] (Cell)

## 2021-09-02 NOTE — Consult Note (Signed)
Hematology/Oncology Consult note  [phone] [phone]  Patient Care Team: Bella Kennedy, MD as PCP - General (Neurology)   Name of the patient: Stuart Mcneil  [id number]  December 10, 1940   Date of visit: 09/02/21 REASON FOR COSULTATION:  Symptomatic anemia, History of presenting illness-  80 y.o. male with PMH listed at below who was advised by me to go to emergency room for evaluation of symptomatic anemia. Patient recently established care with me for evaluation of widespread bone lesions.  Patient was seen by neurosurgery.  Per my discussion with neurosurgeon, no need for steroid given that patient does not have any neurological deficit.  Patient has had outpatient work-up including increased LDH level 677, peripheral blood flow cytometry showed monoclonal lymphocytosis of unknown significance, clonal B cells<5000 cells/ul, PSA of 1.62, AST 105, ALT 62, alkaline phosphatase 254, bilirubin 0.1. Multiple myeloma panel showed M protein of 0.2, IgM band, light chain specificity could not be verified.  Free light chain ratio is pending. Patient was noted to have a hemoglobin of 7.1.  He has acute drop of her baseline of 13 just about 4 weeks ago.  Iron panel is consistent with iron deficiency. Patient also reports shortness of breath with exertion.  He has had a sensation of food stuck in the middle of the chest.  He has a history of Barrett esophagus.  No recent EGD or colonoscopy. Advised patient to go to emergency room for blood transfusion further management. Discussed with gastroenterology and patient has been seen and plan for EGD/colonoscopy tomorrow. Patient received 1 unit of PRBC since admission. To reports feeling slightly better today.    Review of Systems  Constitutional:  Positive for fatigue. Negative for chills and fever.  HENT:   Negative for hearing loss and voice change.   Eyes:  Negative for eye problems and icterus.  Respiratory:  Negative for  chest tightness, cough and shortness of breath.   Cardiovascular:  Negative for chest pain and leg swelling.  Gastrointestinal:  Negative for abdominal distention and abdominal pain.       Dysphagia  Endocrine: Negative for hot flashes.  Genitourinary:  Negative for difficulty urinating, dysuria and frequency.   Musculoskeletal:  Positive for back pain and neck pain. Negative for arthralgias.  Skin:  Negative for itching and rash.  Neurological:  Negative for light-headedness and numbness.  Hematological:  Negative for adenopathy. Does not bruise/bleed easily.  Psychiatric/Behavioral:  Negative for confusion.    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     Past Medical History:  Diagnosis Date   Adenomatous colon polyp    AR (allergic rhinitis)    BPH (benign prostatic hyperplasia)    ED (erectile dysfunction)    HLD (hyperlipidemia)    Prostatitis    RA (rheumatoid arthritis) ([facility])      Past Surgical History:  Procedure Laterality Date   none      Social History   Socioeconomic History   Marital status: Married    Spouse name: Not on file   Number of children: Not on file   Years of education: Not on file   Highest education  level: Not on file  Occupational History   Not on file  Tobacco Use   Smoking status: Former   Smokeless tobacco: Never   Tobacco comments:    quit 30 years  Substance and Sexual Activity   Alcohol use: No    Alcohol/week: 0.0 standard drinks   Drug use: No   Sexual activity: Yes    Birth control/protection: None  Other Topics Concern   Not on file  Social  History Narrative   Not on file   Social Determinants of Health   Financial Resource Strain: Not on file  Food Insecurity: Not on file  Transportation Needs: Not on file  Physical Activity: Not on file  Stress: Not on file  Social Connections: Not on file  Intimate Partner Violence: Not on file     Family History  Problem Relation Age of Onset   Diabetes Mellitus II Father    Diabetes Mellitus II Sister    Kidney disease Neg Hx    Prostate cancer Neg Hx    Bladder Cancer Neg Hx      Current Facility-Administered Medications:    0.9 %  sodium chloride infusion, 10 mL/hr, Intravenous, Once, Ward, Kristen N, DO   0.9 %  sodium chloride infusion, , Intravenous, Continuous, Lavina Hamman, MD, Last Rate: 75 mL/hr at 09/02/21 0931, Rate Change at 09/02/21 0931   acetaminophen (TYLENOL) tablet 650 mg, 650 mg, Oral, Q6H PRN **OR** acetaminophen (TYLENOL) suppository 650 mg, 650 mg, Rectal, Q6H PRN, Mansy, Jan A, MD   folic acid (FOLVITE) tablet 1 mg, 1 mg, Oral, Daily, Mansy, Jan A, MD   HYDROcodone-acetaminophen (NORCO/VICODIN) 5-325 MG per tablet 1 tablet, 1 tablet, Oral, Q4H PRN, Lavina Hamman, MD   magnesium hydroxide (MILK OF MAGNESIA) suspension 30 mL, 30 mL, Oral, Daily PRN, Mansy, Jan A, MD   mirabegron ER (MYRBETRIQ) tablet 50 mg, 50 mg, Oral, Daily, Mansy, Jan A, MD   morphine 2 MG/ML injection 2 mg, 2 mg, Intravenous, Q4H PRN, Mansy, Jan A, MD, 2 mg at 09/02/21 0542   multivitamin with minerals tablet, , Oral, Daily, Mansy, Jan A, MD   ondansetron (ZOFRAN) tablet 4 mg, 4 mg, Oral, Q6H PRN **OR** ondansetron (ZOFRAN) injection 4 mg, 4 mg, Intravenous, Q6H PRN, Mansy, Jan A, MD   pantoprazole (PROTONIX) EC tablet 40 mg, 40 mg, Oral, BID, Lavina Hamman, MD   traZODone (DESYREL) tablet 25 mg, 25 mg, Oral, QHS PRN, Mansy, Jan A, MD  Current Outpatient Medications:    etanercept (ENBREL) 50 MG/ML injection, , Disp: , Rfl:    etodolac (LODINE) 400 MG tablet, Reported on  02/12/2016, Disp: , Rfl:    folic acid (FOLVITE) 1 MG tablet, , Disp: , Rfl:    Glucosamine-Chondroitin 500-400 MG CAPS, Take by mouth., Disp: , Rfl:    HYDROcodone-acetaminophen (NORCO/VICODIN) 5-325 MG tablet, Take 1 tablet by mouth 2 (two) times daily., Disp: , Rfl:    methotrexate (RHEUMATREX) 2.5 MG tablet, , Disp: , Rfl:    mirabegron ER (MYRBETRIQ) 50 MG TB24 tablet, Take 1 tablet (50 mg total) by mouth daily. (Patient not taking: Reported on 08/26/2021), Disp: 90 tablet, Rfl: 3   Multiple Vitamins-Minerals (MULTIVITAMIN ADULT PO), Take by mouth daily., Disp: , Rfl:    naproxen sodium (ALEVE) 220 MG tablet, Take by mouth., Disp: , Rfl:    omeprazole (PRILOSEC) 20 MG capsule, Take by mouth., Disp: , Rfl:    omeprazole (PRILOSEC) 20 MG capsule, Take by mouth., Disp: ,  Rfl:    sildenafil (VIAGRA) 100 MG tablet, Take 1 tablet (100 mg total) by mouth daily as needed for erectile dysfunction., Disp: 30 tablet, Rfl: 3   tadalafil (CIALIS) 5 MG tablet, Take one tablet daily for benign prostatic hyperplasia, Disp: 90 tablet, Rfl: 3   tiZANidine (ZANAFLEX) 2 MG tablet, Take 2 mg by mouth 3 (three) times daily. (Patient not taking: Reported on 08/26/2021), Disp: , Rfl:    Physical exam:  Vitals:   09/02/21 0615 09/02/21 0630 09/02/21 0700 09/02/21 0730  BP: 134/64 137/70 132/62 (!) 144/70  Pulse: 86 82 84 80  Resp: $Remo'19 18 16 18  'QaHEt$ Temp:      TempSrc:      SpO2: 99% 95% 94% 97%  Weight:      Height:       Physical Exam      CMP Latest Ref Rng & Units 09/02/2021  Glucose 70 - 99 mg/dL 109(H)  BUN 8 - 23 mg/dL 11  Creatinine 0.61 - 1.24 mg/dL 0.70  Sodium 135 - 145 mmol/L 138  Potassium 3.5 - 5.1 mmol/L 3.9  Chloride 98 - 111 mmol/L 103  CO2 22 - 32 mmol/L 25  Calcium 8.9 - 10.3 mg/dL 8.4(L)  Total Protein 6.5 - 8.1 g/dL -  Total Bilirubin 0.3 - 1.2 mg/dL -  Alkaline Phos 38 - 126 U/L -  AST 15 - 41 U/L -  ALT 0 - 44 U/L -   CBC Latest Ref Rng & Units 09/02/2021  WBC 4.0 - 10.5  K/uL 4.7  Hemoglobin 13.0 - 17.0 g/dL 8.3(L)  Hematocrit 39.0 - 52.0 % 24.5(L)  Platelets 150 - 400 K/uL 212    RADIOGRAPHIC STUDIES: I have personally reviewed the radiological images as listed and agreed with the findings in the report. DG Chest 2 View  Result Date: 09/01/2021 CLINICAL DATA:  Shortness of breath. EXAM: CHEST - 2 VIEW COMPARISON:  Chest CT 08/27/2021 FINDINGS: The heart is normal in size. Normal mediastinal contours. Slight increase in left lung base atelectasis from recent CT. No confluent consolidation. No pleural effusion, pneumothorax, or pulmonary edema. Patient with known osseous metastatic disease, lesions not well seen on the current exam. IMPRESSION: Slight increase in left lung base atelectasis from recent CT. Electronically Signed   By: Keith Rake M.D.   On: 09/01/2021 19:58   DG Ankle Complete Left  Result Date: 09/02/2021 CLINICAL DATA:  Ankle injury 1 week ago with persistent pain, initial encounter EXAM: LEFT ANKLE COMPLETE - 3+ VIEW COMPARISON:  None. FINDINGS: Very mild lateral soft tissue swelling is noted. No acute fracture or dislocation is seen. IMPRESSION: Lateral soft tissue swelling without acute bony abnormality. Electronically Signed   By: Inez Catalina M.D.   On: 09/02/2021 00:51   CT Angio Chest PE W and/or Wo Contrast  Result Date: 09/02/2021 CLINICAL DATA:  Shortness of breath EXAM: CT ANGIOGRAPHY CHEST WITH CONTRAST TECHNIQUE: Multidetector CT imaging of the chest was performed using the standard protocol during bolus administration of intravenous contrast. Multiplanar CT image reconstructions and MIPs were obtained to evaluate the vascular anatomy. CONTRAST:  71mL OMNIPAQUE IOHEXOL 350 MG/ML SOLN COMPARISON:  Chest x-ray from the previous day, CT from 08/27/2021. FINDINGS: Cardiovascular: Thoracic aorta demonstrates atherosclerotic calcifications without aneurysmal dilatation or dissection. No cardiac enlargement is noted. Coronary  calcifications are seen. Pulmonary artery shows a normal branching pattern. No filling defect to suggest pulmonary embolism is noted. Mediastinum/Nodes: Thoracic inlet is within normal limits. No sizable hilar or  mediastinal adenopathy is noted. The esophagus is air-filled and patulous. Lungs/Pleura: Lungs are well aerated bilaterally. Left lower lobe atelectasis is seen similar to that noted on recent chest x-ray. No sizable effusion is noted. No focal infiltrate on the right is noted. No parenchymal nodules are seen. Upper Abdomen: Visualized upper abdomen is within normal limits. Musculoskeletal: Subtle areas of increased sclerosis are noted throughout the thoracic spine consistent with the known history of metastatic disease. Similar findings are noted within the ribcage. Review of the MIP images confirms the above findings. IMPRESSION: No evidence of pulmonary emboli. Scattered mild subtle areas of increased sclerosis consistent with known metastatic disease within the thoracic spine and to a lesser degree in the ribcage. Slight increase in left lower lobe atelectasis when compare with the prior CT similar to that seen on recent chest x-ray. Aortic Atherosclerosis (ICD10-I70.0). Electronically Signed   By: Inez Catalina M.D.   On: 09/02/2021 03:01   MR CERVICAL SPINE WO CONTRAST  Result Date: 08/14/2021 CLINICAL DATA:  Neck pain radiating into both arms for 1 month. No known injury. History of arthritis. Reported C2 sclerosis on outside radiographs; no given history of malignancy. EXAM: MRI CERVICAL SPINE WITHOUT CONTRAST TECHNIQUE: Multiplanar, multisequence MR imaging of the cervical spine was performed. No intravenous contrast was administered. COMPARISON:  Report only from outside radiographs 08/04/2021. Abdominopelvic CT 06/13/2012. FINDINGS: Alignment: Physiologic. Vertebrae: There is diffuse replacement of the normal marrow signal within the C2 vertebral body and its posterior elements, best seen on  the T1 weighted images. There is similar diffuse replacement of the normal marrow signal within the C3 vertebral body, and other smaller lesions are present throughout the cervical and upper thoracic spine. Findings are consistent with diffuse metastatic disease. There is associated significant anterior epidural tumor anteriorly from C2 through C4. No significant pathologic fracture identified. Cord: The CSF surrounding the cord is effaced by the epidural tumor at C2. No definite cord compression or abnormal cord signal. Posterior Fossa, vertebral arteries, paraspinal tissues: Visualized portions of the posterior fossa appear unremarkable. No significant paraspinal findings. Bilateral vertebral artery flow voids. Disc levels: C2-3: The disc appears normal. As above, there is significant anterior epidural tumor bilaterally related to the extensive metastatic disease involving the C2 and C3 vertebral bodies. There is effacement of the CSF surrounding the cord as well as foraminal extension of epidural tumor bilaterally. C3-4: The disc appears normal. There is lesser anterior epidural tumor at this level with biforaminal extension of tumor. No cord deformity. C4-5: Mild disc bulging and uncinate spurring asymmetric to the right. Mild right foraminal narrowing. No cord deformity. C5-6: Spondylosis with loss of disc height, uncinate spurring and bilateral facet hypertrophy. Resulting mild spinal stenosis with left greater than right foraminal narrowing. C6-7: Mild disc bulging and facet hypertrophy. No significant spinal stenosis or nerve root encroachment. C7-T1: Mild bilateral facet hypertrophy. No significant spinal stenosis or nerve root encroachment. IMPRESSION: 1. 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 Given reported sclerosis on prior radiographs, consider metastatic prostate cancer. Correlate with serum PSA levels. 2. There is significant associated anterior epidural tumor from  C2 through C4 with associated mass effect on the thecal sac and biforaminal extension of tumor at C2-3 and C3-4. These findings place the patient at risk for cord compression which is not definitely demonstrated at this time. Neurosurgical evaluation recommended. 3. Relatively mild spondylosis, greatest at C5-6 and C6-7. 4. These results will be called to the ordering clinician or representative by the  Psychologist, clinical, and communication documented in the PACS or [employer]. Electronically Signed   By: Richardean Sale M.D.   On: 08/14/2021 09:51   MR CERVICAL SPINE W WO CONTRAST  Result Date: 08/23/2021 CLINICAL DATA:  80 year old male with prostate cancer. Constant neck and spine pain. Abnormal cervical spine MRI last month. EXAM: MRI TOTAL SPINE WITHOUT AND WITH CONTRAST TECHNIQUE: Multisequence MR imaging of the spine from the cervical spine to the sacrum was performed prior to and following IV contrast administration for evaluation of spinal metastatic disease. CONTRAST:  6 mL Gadavist COMPARISON:  Cervical spine MRI 08/12/2021. FINDINGS: MRI CERVICAL SPINE FINDINGS Alignment: Stable lordosis. Vertebrae: T1 hypointense metastatic disease throughout the C1, C2, C3 vertebrae. Vertebral bodies and posterior elements affected at those levels. Heterogeneous, subtotal involvement of C4 as demonstrated on the MRI last month. Comparatively mild C5 involvement. Subtotal C6 vertebral involvement may have progressed since last month. C7 relatively spared. 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 Bulky epidural tumor and spinal stenosis continue to the inferior C4 level. And there is associated mild stenosis and mass effect also at the cervicomedullary junction. But despite the mass effect there is lower brainstem or cervical cord edema identified. Tumor infiltrates the bilateral  C3 and C4 neural foramina, surrounds the vertebral arteries at those levels. The prevertebral space is relatively spared. No abnormal intradural enhancement. Posterior Fossa, vertebral arteries, paraspinal tissues: No signal abnormality at the cervicomedullary junction. Negative visible posterior fossa otherwise. Disc levels: Stable degenerative disease. Mild degenerative spinal stenosis at C5-C6 and C6-C7. MRI THORACIC SPINE FINDINGS Alignment:  Preserved thoracic kyphosis. Vertebrae: Widespread osseous metastatic disease. Complete involvement of the T1 and T2 vertebrae. Moderate involvement of T3 (eccentric to the left). Patchy involvement of T4. Subtotal involvement of T5. Patchy involvement of T6 (eccentric to the left). Complete involvement of T7. Subtotal involvement of T8. Near complete involvement of T9 and T10. Subtotal involvement of T11 and T12. Cord: There is mild ventral epidural tumor extension at the T10 level primarily on the left (series 18, image 10). The ventral cord is a faced there. But no thoracic cord compression or cord signal abnormality. No abnormal intradural enhancement or dural thickening. The conus medullaris is at T12-L1. Paraspinal and other soft tissues: No other thoracic extraosseous tumor extension identified. Disc levels: Intermittent thoracic degeneration, disc disease is most pronounced at T6-T7 (series 25, image 12) where the ventral cord is effaced but there is no significant spinal stenosis. MRI LUMBAR SPINE FINDINGS Segmentation:  Normal. Alignment: Preserved lumbar lordosis. There is subtle anterolisthesis of L4 on L5 which appears degenerative. Vertebrae: Widespread osseous metastatic disease. Widespread involvement of L1 and L2 eccentric to the right. Subtotal involvement of the L3 vertebral body. Widespread involvement of L4. Subtotal involvement of the L5 vertebra, with posterior element involvement on the right. Similar Patchy and confluent bilateral sacral metastatic  disease. Conus medullaris: Extends to the T12-L1. Level No lower spinal cord or conus signal abnormality. No abnormal intradural enhancement or dural thickening. Paraspinal and other soft tissues: Negative visible abdominal viscera. Diverticulosis of the distal large bowel. Disc levels: Superimposed lumbar spine degeneration maximal at L4-L5 where mild anterolisthesis is associated with moderate disc and posterior element degeneration resulting in moderate to severe degenerative spinal stenosis. IMPRESSION: 1. 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 Multiple vertebrae completely replaced by tumor. No pathologic fracture identified. 2. Bulky epidural  and extraosseous tumor from the skull base through C4, epicenter at C2. Subsequent malignant cervical spinal stenosis, including at the cervicomedullary junction. Mild cord compression, maximal at C2, but no spinal cord edema. Tumor infiltrates the bilateral C3 and C4 neural foramina. 3. Mild ventral epidural tumor at T10 eccentric to the left. No thoracic cord compression or cord signal abnormality. 4. Superimposed degenerative disease. 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 Electronically Signed   By: Genevie Ann M.D.   On: 08/23/2021 11:32   MR THORACIC SPINE W WO CONTRAST  Result Date: 08/23/2021 CLINICAL DATA:  80 year old male with prostate cancer. Constant neck and spine pain. Abnormal cervical spine MRI last month. EXAM: MRI TOTAL SPINE WITHOUT AND WITH CONTRAST TECHNIQUE: Multisequence MR imaging of the spine from the cervical spine to the sacrum was performed prior to and following IV contrast administration for evaluation of spinal metastatic disease. CONTRAST:  6 mL Gadavist COMPARISON:  Cervical spine MRI 08/12/2021. FINDINGS: MRI CERVICAL SPINE FINDINGS Alignment: Stable lordosis. Vertebrae: T1 hypointense metastatic disease throughout the C1, C2, C3 vertebrae. Vertebral bodies  and posterior elements affected at those levels. Heterogeneous, subtotal involvement of C4 as demonstrated on the MRI last month. Comparatively mild C5 involvement. Subtotal C6 vertebral involvement may have progressed since last month. C7 relatively spared. 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 Bulky epidural tumor and spinal stenosis continue to the inferior C4 level. And there is associated mild stenosis and mass effect also at the cervicomedullary junction. But despite the mass effect there is lower brainstem or cervical cord edema identified. Tumor infiltrates the bilateral C3 and C4 neural foramina, surrounds the vertebral arteries at those levels. The prevertebral space is relatively spared. No abnormal intradural enhancement. Posterior Fossa, vertebral arteries, paraspinal tissues: No signal abnormality at the cervicomedullary junction. Negative visible posterior fossa otherwise. Disc levels: Stable degenerative disease. Mild degenerative spinal stenosis at C5-C6 and C6-C7. MRI THORACIC SPINE FINDINGS Alignment:  Preserved thoracic kyphosis. Vertebrae: Widespread osseous metastatic disease. Complete involvement of the T1 and T2 vertebrae. Moderate involvement of T3 (eccentric to the left). Patchy involvement of T4. Subtotal involvement of T5. Patchy involvement of T6 (eccentric to the left). Complete involvement of T7. Subtotal involvement of T8. Near complete involvement of T9 and T10. Subtotal involvement of T11 and T12. Cord: There is mild ventral epidural tumor extension at the T10 level primarily on the left (series 18, image 10). The ventral cord is a faced there. But no thoracic cord compression or cord signal abnormality. No abnormal intradural enhancement or dural thickening. The conus medullaris is at T12-L1. Paraspinal and other soft tissues: No other thoracic  extraosseous tumor extension identified. Disc levels: Intermittent thoracic degeneration, disc disease is most pronounced at T6-T7 (series 25, image 12) where the ventral cord is effaced but there is no significant spinal stenosis. MRI LUMBAR SPINE FINDINGS Segmentation:  Normal. Alignment: Preserved lumbar lordosis. There is subtle anterolisthesis of L4 on L5 which appears degenerative. Vertebrae: Widespread osseous metastatic disease. Widespread involvement of L1 and L2 eccentric to the right. Subtotal involvement of the L3 vertebral body. Widespread involvement of L4. Subtotal involvement of the L5 vertebra, with posterior element involvement on the right. Similar Patchy and confluent bilateral sacral metastatic disease. Conus medullaris: Extends to the T12-L1. Level No lower spinal cord or conus signal abnormality. No abnormal intradural enhancement  or dural thickening. Paraspinal and other soft tissues: Negative visible abdominal viscera. Diverticulosis of the distal large bowel. Disc levels: Superimposed lumbar spine degeneration maximal at L4-L5 where mild anterolisthesis is associated with moderate disc and posterior element degeneration resulting in moderate to severe degenerative spinal stenosis. IMPRESSION: 1. 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 Multiple vertebrae completely replaced by tumor. No pathologic fracture identified. 2. 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 Subsequent malignant cervical spinal stenosis, including at the cervicomedullary junction. Mild cord compression, maximal at C2, but no spinal cord edema. Tumor infiltrates the bilateral C3 and C4 neural foramina. 3. Mild ventral epidural tumor at T10 eccentric to the left. No thoracic cord compression or cord signal abnormality. 4. Superimposed degenerative disease. 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  Electronically Signed   By: Genevie Ann M.D.   On: 08/23/2021 11:32   MR Lumbar Spine W Wo Contrast  Result Date: 08/23/2021 CLINICAL DATA:  80 year old male with prostate cancer. Constant neck and spine pain. Abnormal cervical spine MRI last month. EXAM: MRI TOTAL SPINE WITHOUT AND WITH CONTRAST TECHNIQUE: Multisequence MR imaging of the spine from the cervical spine to the sacrum was performed prior to and following IV contrast administration for evaluation of spinal metastatic disease. CONTRAST:  6 mL Gadavist COMPARISON:  Cervical spine MRI 08/12/2021. FINDINGS: MRI CERVICAL SPINE FINDINGS Alignment: Stable lordosis. Vertebrae: T1 hypointense metastatic disease throughout the C1, C2, C3 vertebrae. Vertebral bodies and posterior elements affected at those levels. Heterogeneous, subtotal involvement of C4 as demonstrated on the MRI last month. Comparatively mild C5 involvement. Subtotal C6 vertebral involvement may have progressed since last month. C7 relatively spared. 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 Bulky epidural tumor and spinal stenosis continue to the inferior C4 level. And there is associated mild stenosis and mass effect also at the cervicomedullary junction. But despite the mass effect there is lower brainstem or cervical cord edema identified. Tumor infiltrates the bilateral C3 and C4 neural foramina, surrounds the vertebral arteries at those levels. The prevertebral space is relatively spared. No abnormal intradural enhancement. Posterior Fossa, vertebral arteries, paraspinal tissues: No signal abnormality at the cervicomedullary junction. Negative visible posterior fossa otherwise. Disc levels: Stable degenerative disease. Mild degenerative spinal stenosis at C5-C6 and C6-C7. MRI THORACIC SPINE FINDINGS Alignment:  Preserved thoracic kyphosis. Vertebrae:  Widespread osseous metastatic disease. Complete involvement of the T1 and T2 vertebrae. Moderate involvement of T3 (eccentric to the left). Patchy involvement of T4. Subtotal involvement of T5. Patchy involvement of T6 (eccentric to the left). Complete involvement of T7. Subtotal involvement of T8. Near complete involvement of T9 and T10. Subtotal involvement of T11 and T12. Cord: There is mild ventral epidural tumor extension at the T10 level primarily on the left (series 18, image 10). The ventral cord is a faced there. But no thoracic cord compression or cord signal abnormality. No abnormal intradural enhancement or dural thickening. The conus medullaris is at T12-L1. Paraspinal and other soft tissues: No other thoracic extraosseous tumor extension identified. Disc levels: Intermittent thoracic degeneration, disc disease is most pronounced at T6-T7 (series 25, image 12) where the ventral cord is effaced but there is no significant spinal stenosis. MRI LUMBAR SPINE FINDINGS Segmentation:  Normal. Alignment: Preserved lumbar lordosis. There is subtle anterolisthesis of L4 on L5  which appears degenerative. Vertebrae: Widespread osseous metastatic disease. Widespread involvement of L1 and L2 eccentric to the right. Subtotal involvement of the L3 vertebral body. Widespread involvement of L4. Subtotal involvement of the L5 vertebra, with posterior element involvement on the right. Similar Patchy and confluent bilateral sacral metastatic disease. Conus medullaris: Extends to the T12-L1. Level No lower spinal cord or conus signal abnormality. No abnormal intradural enhancement or dural thickening. Paraspinal and other soft tissues: Negative visible abdominal viscera. Diverticulosis of the distal large bowel. Disc levels: Superimposed lumbar spine degeneration maximal at L4-L5 where mild anterolisthesis is associated with moderate disc and posterior element degeneration resulting in moderate to severe degenerative spinal  stenosis. IMPRESSION: 1. 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 Multiple vertebrae completely replaced by tumor. No pathologic fracture identified. 2. 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 Subsequent malignant cervical spinal stenosis, including at the cervicomedullary junction. Mild cord compression, maximal at C2, but no spinal cord edema. Tumor infiltrates the bilateral C3 and C4 neural foramina. 3. Mild ventral epidural tumor at T10 eccentric to the left. No thoracic cord compression or cord signal abnormality. 4. Superimposed degenerative disease. 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 Electronically Signed   By: Odessa Fleming M.D.   On: 08/23/2021 11:32   CT CHEST ABDOMEN PELVIS W CONTRAST  Result Date: 08/28/2021 CLINICAL DATA:  Osseous metastatic disease, history of prostate cancer, weight loss EXAM: CT CHEST, ABDOMEN, AND PELVIS WITH CONTRAST TECHNIQUE: Multidetector CT imaging of the chest, abdomen and pelvis was performed following the standard protocol during bolus administration of intravenous contrast. CONTRAST:  OMNIPAQUE IOHEXOL 300 MG/ML SOLN, additional oral enteric contrast COMPARISON:  MR total spine, 08/23/2021 CT abdomen pelvis, 06/13/2012 FINDINGS: CT CHEST FINDINGS Cardiovascular: Aortic atherosclerosis. Normal heart size. Three-vessel coronary artery calcifications. No pericardial effusion. Mediastinum/Nodes: No enlarged mediastinal, hilar, or axillary lymph nodes. Thyroid gland, trachea, and esophagus demonstrate no significant findings. Lungs/Pleura: Mild dependent bibasilar scarring and or partial atelectasis. No pleural effusion or pneumothorax. Musculoskeletal: No chest wall mass. CT ABDOMEN PELVIS FINDINGS Hepatobiliary: No solid liver abnormality is seen. No gallstones, gallbladder wall thickening, or biliary dilatation. Pancreas: Unremarkable. No  pancreatic ductal dilatation or surrounding inflammatory changes. Spleen: Normal in size without significant abnormality. Adrenals/Urinary Tract: Adrenal glands are unremarkable. Kidneys are normal, without renal calculi, solid lesion, or hydronephrosis. Bladder is unremarkable. Stomach/Bowel: Stomach is within normal limits. Appendix appears normal. No evidence of bowel wall thickening, distention, or inflammatory changes. Sigmoid diverticulosis. Vascular/Lymphatic: Aortic atherosclerosis. No enlarged abdominal or pelvic lymph nodes. Reproductive: Prostatomegaly. Other: No abdominal wall hernia or abnormality. No abdominopelvic ascites. 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 No overtly lytic lesions identified. Left eccentric epidural soft tissue at T10 is only very subtly appreciated by CT (series 2, image 45). IMPRESSION: 1. 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 No overtly lytic lesions identified. 2. Left eccentric epidural soft tissue at T10 is only very subtly appreciated by CT. 3. No evidence of primary malignancy, lymphadenopathy or other soft tissue metastatic disease in the chest, abdomen, or pelvis. 4. Prostatomegaly. 5. Coronary artery disease. Aortic Atherosclerosis (ICD10-I70.0). Electronically Signed   By: Bonna Gains.D.  On: 08/28/2021 06:48   US Venous Img Lower Unilateral Left  Result Date: 09/02/2021 CLINICAL DATA:  Left leg pain and swelling for several days EXAM: LEFT LOWER EXTREMITY VENOUS DOPPLER ULTRASOUND TECHNIQUE: Gray-scale sonography with graded compression, as well as color Doppler and duplex ultrasound were performed to  evaluate the lower extremity deep venous systems from the level of the common femoral vein and including the common femoral, femoral, profunda femoral, popliteal and calf veins including the posterior tibial, peroneal and gastrocnemius veins when visible. The superficial great saphenous vein was also interrogated. Spectral Doppler was utilized to evaluate flow at rest and with distal augmentation maneuvers in the common femoral, femoral and popliteal veins. COMPARISON:  None. FINDINGS: Contralateral Common Femoral Vein: Respiratory phasicity is normal and symmetric with the symptomatic side. No evidence of thrombus. Normal compressibility. Common Femoral Vein: No evidence of thrombus. Normal compressibility, respiratory phasicity and response to augmentation. Saphenofemoral Junction: No evidence of thrombus. Normal compressibility and flow on color Doppler imaging. Profunda Femoral Vein: No evidence of thrombus. Normal compressibility and flow on color Doppler imaging. Femoral Vein: No evidence of thrombus. Normal compressibility, respiratory phasicity and response to augmentation. Popliteal Vein: No evidence of thrombus. Normal compressibility, respiratory phasicity and response to augmentation. Calf Veins: No evidence of thrombus. Normal compressibility and flow on color Doppler imaging. Superficial Great Saphenous Vein: No evidence of thrombus. Normal compressibility. Venous Reflux:  None. Other Findings: Complex fluid collection is noted in the left popliteal fossa measuring 2.3 x 1.6 cm consistent with a popliteal cyst. IMPRESSION: No evidence of deep venous thrombosis. Small popliteal cyst. Electronically Signed   By: Inez Catalina M.D.   On: 09/02/2021 02:31    Assessment and plan-   #Symptomatic anemia, work-up is consistent with iron deficiency anemia.  Suspect GI blood loss. Status post 1 PRBC transfusion. I discussed with patient about IV Venofer.  Allergy reactions/infusion reaction including  anaphylactic reaction were discussed with patient. Other side effects include but not limited to high blood pressure, skin rash, weight gain, leg swelling, etc. Patient voices understanding and willing to proceed. I ordered IV Venofer 200 mg daily x2.   #Dysphagia, iron deficiency. Appreciate gastroenterology input.  EGD colonoscopy tomorrow.  #Widespread metastatic bone lesions. CT chest abdomen pelvis with contrast did not reveal primary malignancy, lymphadenopathy or other soft tissue metastatic disease.  Bone lesions.  Prostatectomy, coronary artery disease. 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  IgM, free light chain ratio is still pending. 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  If no suspicious lesions on EGD/colonoscopy, will recommend biopsy his bone lesions.-Patient has established with neurosurgery.    Thank you for allowing me to participate in the care of this patient.     Earlie Server, MD, PhD 09/02/2021

## 2021-09-02 NOTE — ED Notes (Signed)
Hospitalist at bedside again. Diet changed from NPO to clear liquid for today. Will be NPO after midnight. GI will see pt tomorrow.

## 2021-09-03 ENCOUNTER — Inpatient Hospital Stay: Payer: Medicare Other | Admitting: Anesthesiology

## 2021-09-03 ENCOUNTER — Encounter
Admission: EM | Disposition: A | Discharge status: Home / Self Care | Payer: Self-pay | Source: Home / Self Care | Attending: Internal Medicine

## 2021-09-03 ENCOUNTER — Encounter: Payer: Self-pay | Admitting: Internal Medicine

## 2021-09-03 ENCOUNTER — Telehealth: Payer: Self-pay

## 2021-09-03 DIAGNOSIS — C7951 Secondary malignant neoplasm of bone: Secondary | ICD-10-CM

## 2021-09-03 DIAGNOSIS — R634 Abnormal weight loss: Secondary | ICD-10-CM

## 2021-09-03 DIAGNOSIS — D508 Other iron deficiency anemias: Secondary | ICD-10-CM

## 2021-09-03 DIAGNOSIS — D649 Anemia, unspecified: Secondary | ICD-10-CM | POA: Diagnosis not present

## 2021-09-03 DIAGNOSIS — K2289 Other specified disease of esophagus: Secondary | ICD-10-CM

## 2021-09-03 DIAGNOSIS — D500 Iron deficiency anemia secondary to blood loss (chronic): Secondary | ICD-10-CM | POA: Diagnosis not present

## 2021-09-03 DIAGNOSIS — R1319 Other dysphagia: Secondary | ICD-10-CM | POA: Diagnosis not present

## 2021-09-03 DIAGNOSIS — Z129 Encounter for screening for malignant neoplasm, site unspecified: Secondary | ICD-10-CM

## 2021-09-03 HISTORY — PX: ESOPHAGOGASTRODUODENOSCOPY: SHX5428

## 2021-09-03 HISTORY — PX: COLONOSCOPY: SHX5424

## 2021-09-03 LAB — TYPE AND SCREEN
ABO/RH(D): O POS
Antibody Screen: NEGATIVE
Unit division: 0

## 2021-09-03 LAB — BPAM RBC
Blood Product Expiration Date: 202212202359
ISSUE DATE / TIME: 202211170336
Unit Type and Rh: 5100

## 2021-09-03 LAB — HEMOGLOBIN AND HEMATOCRIT, BLOOD
HCT: 25.2 % — ABNORMAL LOW (ref 39.0–52.0)
HCT: 26.4 % — ABNORMAL LOW (ref 39.0–52.0)
Hemoglobin: 8.2 g/dL — ABNORMAL LOW (ref 13.0–17.0)
Hemoglobin: 8.7 g/dL — ABNORMAL LOW (ref 13.0–17.0)

## 2021-09-03 LAB — HAPTOGLOBIN: Haptoglobin: 118 mg/dL (ref 34–355)

## 2021-09-03 SURGERY — EGD (ESOPHAGOGASTRODUODENOSCOPY)
Anesthesia: General

## 2021-09-03 MED ORDER — ENSURE NUTRITION SHAKE PO LIQD: 1.0000 | Freq: Three times a day (TID) | ORAL | 0 refills | Status: AC

## 2021-09-03 MED ORDER — PROPOFOL 500 MG/50ML IV EMUL
INTRAVENOUS | Status: AC
  Filled 2021-09-03: qty 50

## 2021-09-03 MED ORDER — LIDOCAINE HCL (CARDIAC) PF 100 MG/5ML IV SOSY
PREFILLED_SYRINGE | INTRAVENOUS | Status: DC | PRN
  Administered 2021-09-03: 50 mg via INTRAVENOUS

## 2021-09-03 MED ORDER — SODIUM CHLORIDE 0.9 % IV SOLN
INTRAVENOUS | Status: DC
  Administered 2021-09-03: 1000 mL via INTRAVENOUS

## 2021-09-03 MED ORDER — LIDOCAINE HCL (PF) 2 % IJ SOLN
INTRAMUSCULAR | Status: AC
  Filled 2021-09-03: qty 5

## 2021-09-03 MED ORDER — PROPOFOL 500 MG/50ML IV EMUL
INTRAVENOUS | Status: DC | PRN
  Administered 2021-09-03: 150 ug/kg/min via INTRAVENOUS

## 2021-09-03 MED ORDER — HYDROCODONE-ACETAMINOPHEN 5-325 MG PO TABS: 1.0000 | ORAL_TABLET | Freq: Three times a day (TID) | ORAL | 0 refills | Status: AC | PRN

## 2021-09-03 MED ORDER — PROPOFOL 10 MG/ML IV BOLUS
INTRAVENOUS | Status: DC | PRN
  Administered 2021-09-03: 60 mg via INTRAVENOUS

## 2021-09-03 NOTE — Anesthesia Preprocedure Evaluation (Signed)
Anesthesia Evaluation  Patient identified by MRN, date of birth, ID band Patient awake    Reviewed: Allergy & Precautions, NPO status , Patient's Chart, lab work & pertinent test results  Airway Mallampati: II  TM Distance: >3 FB Neck ROM: full    Dental no notable dental hx.    Pulmonary neg pulmonary ROS, former smoker,    Pulmonary exam normal        Cardiovascular negative cardio ROS Normal cardiovascular exam     Neuro/Psych negative neurological ROS  negative psych ROS   GI/Hepatic negative GI ROS, Neg liver ROS,   Endo/Other  negative endocrine ROS  Renal/GU negative Renal ROS  negative genitourinary   Musculoskeletal   Abdominal Normal abdominal exam  (+)   Peds  Hematology negative hematology ROS (+)   Anesthesia Other Findings Past Medical History: No date: Adenomatous colon polyp No date: AR (allergic rhinitis) No date: BPH (benign prostatic hyperplasia) No date: ED (erectile dysfunction) No date: HLD (hyperlipidemia) No date: Prostatitis No date: RA (rheumatoid arthritis) (HCC)  Past Surgical History: No date: COLONOSCOPY WITH ESOPHAGOGASTRODUODENOSCOPY (EGD); N/A No date: none  BMI    Body Mass Index: 19.67 kg/m      Reproductive/Obstetrics negative OB ROS                             Anesthesia Physical Anesthesia Plan  ASA: 2  Anesthesia Plan: General   Post-op Pain Management:    Induction: Intravenous  PONV Risk Score and Plan: Propofol infusion and TIVA  Airway Management Planned: Natural Airway and Nasal Cannula  Additional Equipment:   Intra-op Plan:   Post-operative Plan:   Informed Consent: I have reviewed the patients History and Physical, chart, labs and discussed the procedure including the risks, benefits and alternatives for the proposed anesthesia with the patient or authorized representative who has indicated his/her understanding and  acceptance.     Dental Advisory Given  Plan Discussed with: Anesthesiologist, CRNA and Surgeon  Anesthesia Plan Comments: (Patient consented for risks of anesthesia including but not limited to:  - adverse reactions to medications - risk of airway placement if required - damage to eyes, teeth, lips or other oral mucosa - nerve damage due to positioning  - sore throat or hoarseness - Damage to heart, brain, nerves, lungs, other parts of body or loss of life  Patient voiced understanding.)        Anesthesia Quick Evaluation

## 2021-09-03 NOTE — Progress Notes (Signed)
Hematology/Oncology Progress Note  [phone] [phone]  Patient Care Team: Bella Kennedy, MD as PCP - General (Neurology)   Name of the patient: Stuart Mcneil  [id number]  1940/10/25  Date of visit: 09/03/21   INTERVAL HISTORY-  Status post EGD and colonoscopy this afternoon. Colonoscopy showed diverticulosis.  EGD showed medium-sized ulcerating mass with no bleeding and no stigmata of recent bleeding was found in the distal esophagus, 34 to 38 cm from incisors.  Mass was partially obstructing and partially circumferential.  Biopsy was taken.  Single mucosal papule found in the stomach.  Biopsied.  Normal duodenum.  Patient received a 2 IV Venofer 200 mg doses during admission. Son Gerald Stabs and daughter-in-law Freda Munro were at the bedside.   No Known Allergies  Current Facility-Administered Medications:    0.9 %  sodium chloride infusion, 10 mL/hr, Intravenous, Once, Ward, Kristen N, DO   0.9 %  sodium chloride infusion, , Intravenous, Continuous, Lavina Hamman, MD, Last Rate: 75 mL/hr at 09/02/21 0931, Rate Change at 09/02/21 0931   acetaminophen (TYLENOL) tablet 650 mg, 650 mg, Oral, Q6H PRN **OR** acetaminophen (TYLENOL) suppository 650 mg, 650 mg, Rectal, Q6H PRN, Mansy, Jan A, MD   bisacodyl (DULCOLAX) EC tablet 20 mg, 20 mg, Oral, Once, Croley, Granville M, PA-C   folic acid (FOLVITE) tablet 1 mg, 1 mg, Oral, Daily, Mansy, Jan A, MD, 1 mg at 09/03/21 1036   HYDROcodone-acetaminophen (NORCO/VICODIN) 5-325 MG per tablet 1 tablet, 1 tablet, Oral, Q4H PRN, Lavina Hamman, MD, 1 tablet at 09/03/21 1036   magnesium hydroxide (MILK OF MAGNESIA) suspension 30 mL, 30 mL, Oral, Daily PRN, Mansy, Jan A, MD   mirabegron ER (MYRBETRIQ) tablet 50 mg, 50 mg, Oral, Daily, Mansy, Jan A, MD, 50 mg at 09/03/21 1036   morphine 2 MG/ML injection 2 mg, 2 mg, Intravenous, Q4H PRN, Mansy, Jan A, MD, 2 mg at 09/02/21 0542   multivitamin with minerals tablet, , Oral, Daily,  Mansy, Jan A, MD, 1 tablet at 09/03/21 1035   ondansetron (ZOFRAN) tablet 4 mg, 4 mg, Oral, Q6H PRN **OR** ondansetron (ZOFRAN) injection 4 mg, 4 mg, Intravenous, Q6H PRN, Mansy, Jan A, MD   pantoprazole (PROTONIX) EC tablet 40 mg, 40 mg, Oral, BID, Lavina Hamman, MD, 40 mg at 09/03/21 1036   traZODone (DESYREL) tablet 25 mg, 25 mg, Oral, QHS PRN, Mansy, Arvella Merles, MD   Physical exam:  Vitals:   09/03/21 1511 09/03/21 1513 09/03/21 1531 09/03/21 1622  BP: 128/61 128/61 118/60 (!) 156/74  Pulse: 81 81  84  Resp: 19 19  16   Temp: 97.6 F (36.4 C)   97.8 F (36.6 C)  TempSrc: Temporal     SpO2: 94% 94%  98%  Weight:      Height:       Physical Exam Constitutional:      General: He is not in acute distress.    Appearance: He is not diaphoretic.  HENT:     Head: Normocephalic and atraumatic.     Nose: Nose normal.     Mouth/Throat:     Pharynx: No oropharyngeal exudate.  Eyes:     General: No scleral icterus.    Pupils: Pupils are equal, round, and reactive to light.  Cardiovascular:     Rate and Rhythm: Normal rate and regular rhythm.     Heart sounds: No murmur heard. Pulmonary:     Effort: Pulmonary effort is normal. No respiratory distress.  Breath sounds: No rales.  Chest:     Chest wall: No tenderness.  Abdominal:     General: There is no distension.     Palpations: Abdomen is soft.     Tenderness: There is no abdominal tenderness.  Musculoskeletal:        General: Normal range of motion.     Cervical back: Normal range of motion and neck supple.  Skin:    General: Skin is warm and dry.     Coloration: Skin is pale.     Findings: No erythema.  Neurological:     Mental Status: He is alert and oriented to person, place, and time.     Cranial Nerves: No cranial nerve deficit.     Motor: No abnormal muscle tone.     Coordination: Coordination normal.  Psychiatric:        Mood and Affect: Affect normal.       CMP Latest Ref Rng & Units 09/02/2021  Glucose  70 - 99 mg/dL 109(H)  BUN 8 - 23 mg/dL 11  Creatinine 0.61 - 1.24 mg/dL 0.70  Sodium 135 - 145 mmol/L 138  Potassium 3.5 - 5.1 mmol/L 3.9  Chloride 98 - 111 mmol/L 103  CO2 22 - 32 mmol/L 25  Calcium 8.9 - 10.3 mg/dL 8.4(L)  Total Protein 6.5 - 8.1 g/dL -  Total Bilirubin 0.3 - 1.2 mg/dL -  Alkaline Phos 38 - 126 U/L -  AST 15 - 41 U/L -  ALT 0 - 44 U/L -   CBC Latest Ref Rng & Units 09/03/2021  WBC 4.0 - 10.5 K/uL -  Hemoglobin 13.0 - 17.0 g/dL 8.2(L)  Hematocrit 39.0 - 52.0 % 25.2(L)  Platelets 150 - 400 K/uL -    RADIOGRAPHIC STUDIES: I have personally reviewed the radiological images as listed and agreed with the findings in the report. DG Chest 2 View  Result Date: 09/01/2021 CLINICAL DATA:  Shortness of breath. EXAM: CHEST - 2 VIEW COMPARISON:  Chest CT 08/27/2021 FINDINGS: The heart is normal in size. Normal mediastinal contours. Slight increase in left lung base atelectasis from recent CT. No confluent consolidation. No pleural effusion, pneumothorax, or pulmonary edema. Patient with known osseous metastatic disease, lesions not well seen on the current exam. IMPRESSION: Slight increase in left lung base atelectasis from recent CT. Electronically Signed   By: Keith Rake M.D.   On: 09/01/2021 19:58   DG Ankle Complete Left  Result Date: 09/02/2021 CLINICAL DATA:  Ankle injury 1 week ago with persistent pain, initial encounter EXAM: LEFT ANKLE COMPLETE - 3+ VIEW COMPARISON:  None. FINDINGS: Very mild lateral soft tissue swelling is noted. No acute fracture or dislocation is seen. IMPRESSION: Lateral soft tissue swelling without acute bony abnormality. Electronically Signed   By: Inez Catalina M.D.   On: 09/02/2021 00:51   CT Angio Chest PE W and/or Wo Contrast  Result Date: 09/02/2021 CLINICAL DATA:  Shortness of breath EXAM: CT ANGIOGRAPHY CHEST WITH CONTRAST TECHNIQUE: Multidetector CT imaging of the chest was performed using the standard protocol during bolus  administration of intravenous contrast. Multiplanar CT image reconstructions and MIPs were obtained to evaluate the vascular anatomy. CONTRAST:  38mL OMNIPAQUE IOHEXOL 350 MG/ML SOLN COMPARISON:  Chest x-ray from the previous day, CT from 08/27/2021. FINDINGS: Cardiovascular: Thoracic aorta demonstrates atherosclerotic calcifications without aneurysmal dilatation or dissection. No cardiac enlargement is noted. Coronary calcifications are seen. Pulmonary artery shows a normal branching pattern. No filling defect to suggest pulmonary embolism is  noted. Mediastinum/Nodes: Thoracic inlet is within normal limits. No sizable hilar or mediastinal adenopathy is noted. The esophagus is air-filled and patulous. Lungs/Pleura: Lungs are well aerated bilaterally. Left lower lobe atelectasis is seen similar to that noted on recent chest x-ray. No sizable effusion is noted. No focal infiltrate on the right is noted. No parenchymal nodules are seen. Upper Abdomen: Visualized upper abdomen is within normal limits. Musculoskeletal: Subtle areas of increased sclerosis are noted throughout the thoracic spine consistent with the known history of metastatic disease. Similar findings are noted within the ribcage. Review of the MIP images confirms the above findings. IMPRESSION: No evidence of pulmonary emboli. Scattered mild subtle areas of increased sclerosis consistent with known metastatic disease within the thoracic spine and to a lesser degree in the ribcage. Slight increase in left lower lobe atelectasis when compare with the prior CT similar to that seen on recent chest x-ray. Aortic Atherosclerosis (ICD10-I70.0). Electronically Signed   By: Inez Catalina M.D.   On: 09/02/2021 03:01   MR CERVICAL SPINE WO CONTRAST  Result Date: 08/14/2021 CLINICAL DATA:  Neck pain radiating into both arms for 1 month. No known injury. History of arthritis. Reported C2 sclerosis on outside radiographs; no given history of malignancy. EXAM: MRI  CERVICAL SPINE WITHOUT CONTRAST TECHNIQUE: Multiplanar, multisequence MR imaging of the cervical spine was performed. No intravenous contrast was administered. COMPARISON:  Report only from outside radiographs 08/04/2021. Abdominopelvic CT 06/13/2012. FINDINGS: Alignment: Physiologic. Vertebrae: There is diffuse replacement of the normal marrow signal within the C2 vertebral body and its posterior elements, best seen on the T1 weighted images. There is similar diffuse replacement of the normal marrow signal within the C3 vertebral body, and other smaller lesions are present throughout the cervical and upper thoracic spine. Findings are consistent with diffuse metastatic disease. There is associated significant anterior epidural tumor anteriorly from C2 through C4. No significant pathologic fracture identified. Cord: The CSF surrounding the cord is effaced by the epidural tumor at C2. No definite cord compression or abnormal cord signal. Posterior Fossa, vertebral arteries, paraspinal tissues: Visualized portions of the posterior fossa appear unremarkable. No significant paraspinal findings. Bilateral vertebral artery flow voids. Disc levels: C2-3: The disc appears normal. As above, there is significant anterior epidural tumor bilaterally related to the extensive metastatic disease involving the C2 and C3 vertebral bodies. There is effacement of the CSF surrounding the cord as well as foraminal extension of epidural tumor bilaterally. C3-4: The disc appears normal. There is lesser anterior epidural tumor at this level with biforaminal extension of tumor. No cord deformity. C4-5: Mild disc bulging and uncinate spurring asymmetric to the right. Mild right foraminal narrowing. No cord deformity. C5-6: Spondylosis with loss of disc height, uncinate spurring and bilateral facet hypertrophy. Resulting mild spinal stenosis with left greater than right foraminal narrowing. C6-7: Mild disc bulging and facet hypertrophy. No  significant spinal stenosis or nerve root encroachment. C7-T1: Mild bilateral facet hypertrophy. No significant spinal stenosis or nerve root encroachment. IMPRESSION: 1. 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 Given reported sclerosis on prior radiographs, consider metastatic prostate cancer. Correlate with serum PSA levels. 2. There is significant associated anterior epidural tumor from C2 through C4 with associated mass effect on the thecal sac and biforaminal extension of tumor at C2-3 and C3-4. These findings place the patient at risk for cord compression which is not definitely demonstrated at this time. Neurosurgical evaluation recommended. 3. Relatively mild spondylosis, greatest at C5-6 and C6-7. 4. These  results will be called to the ordering clinician or representative by the Radiologist Assistant, and communication documented in the PACS or [employer]. Electronically Signed   By: Richardean Sale M.D.   On: 08/14/2021 09:51   MR CERVICAL SPINE W WO CONTRAST  Result Date: 08/23/2021 CLINICAL DATA:  80 year old male with prostate cancer. Constant neck and spine pain. Abnormal cervical spine MRI last month. EXAM: MRI TOTAL SPINE WITHOUT AND WITH CONTRAST TECHNIQUE: Multisequence MR imaging of the spine from the cervical spine to the sacrum was performed prior to and following IV contrast administration for evaluation of spinal metastatic disease. CONTRAST:  6 mL Gadavist COMPARISON:  Cervical spine MRI 08/12/2021. FINDINGS: MRI CERVICAL SPINE FINDINGS Alignment: Stable lordosis. Vertebrae: T1 hypointense metastatic disease throughout the C1, C2, C3 vertebrae. Vertebral bodies and posterior elements affected at those levels. Heterogeneous, subtotal involvement of C4 as demonstrated on the MRI last month. Comparatively mild C5 involvement. Subtotal C6 vertebral involvement may have progressed since last month. C7 relatively spared. Cord: Bulky epidural and  extraosseous tumor centered at C2. 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 Bulky epidural tumor and spinal stenosis continue to the inferior C4 level. And there is associated mild stenosis and mass effect also at the cervicomedullary junction. But despite the mass effect there is lower brainstem or cervical cord edema identified. Tumor infiltrates the bilateral C3 and C4 neural foramina, surrounds the vertebral arteries at those levels. The prevertebral space is relatively spared. No abnormal intradural enhancement. Posterior Fossa, vertebral arteries, paraspinal tissues: No signal abnormality at the cervicomedullary junction. Negative visible posterior fossa otherwise. Disc levels: Stable degenerative disease. Mild degenerative spinal stenosis at C5-C6 and C6-C7. MRI THORACIC SPINE FINDINGS Alignment:  Preserved thoracic kyphosis. Vertebrae: Widespread osseous metastatic disease. Complete involvement of the T1 and T2 vertebrae. Moderate involvement of T3 (eccentric to the left). Patchy involvement of T4. Subtotal involvement of T5. Patchy involvement of T6 (eccentric to the left). Complete involvement of T7. Subtotal involvement of T8. Near complete involvement of T9 and T10. Subtotal involvement of T11 and T12. Cord: There is mild ventral epidural tumor extension at the T10 level primarily on the left (series 18, image 10). The ventral cord is a faced there. But no thoracic cord compression or cord signal abnormality. No abnormal intradural enhancement or dural thickening. The conus medullaris is at T12-L1. Paraspinal and other soft tissues: No other thoracic extraosseous tumor extension identified. Disc levels: Intermittent thoracic degeneration, disc disease is most pronounced at T6-T7 (series 25, image 12) where the ventral cord is effaced but there is no significant spinal stenosis. MRI LUMBAR SPINE FINDINGS Segmentation:  Normal.  Alignment: Preserved lumbar lordosis. There is subtle anterolisthesis of L4 on L5 which appears degenerative. Vertebrae: Widespread osseous metastatic disease. Widespread involvement of L1 and L2 eccentric to the right. Subtotal involvement of the L3 vertebral body. Widespread involvement of L4. Subtotal involvement of the L5 vertebra, with posterior element involvement on the right. Similar Patchy and confluent bilateral sacral metastatic disease. Conus medullaris: Extends to the T12-L1. Level No lower spinal cord or conus signal abnormality. No abnormal intradural enhancement or dural thickening. Paraspinal and other soft tissues: Negative visible abdominal viscera. Diverticulosis of the distal large bowel. Disc levels: Superimposed lumbar spine degeneration maximal at L4-L5 where mild anterolisthesis is associated with moderate disc and posterior element degeneration resulting in moderate to severe degenerative spinal stenosis. IMPRESSION: 1. 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 Multiple  vertebrae completely replaced by tumor. No pathologic fracture identified. 2. 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 Subsequent malignant cervical spinal stenosis, including at the cervicomedullary junction. Mild cord compression, maximal at C2, but no spinal cord edema. Tumor infiltrates the bilateral C3 and C4 neural foramina. 3. Mild ventral epidural tumor at T10 eccentric to the left. No thoracic cord compression or cord signal abnormality. 4. Superimposed degenerative disease. 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 Electronically Signed   By: Genevie Ann M.D.   On: 08/23/2021 11:32   MR THORACIC SPINE W WO CONTRAST  Result Date: 08/23/2021 CLINICAL DATA:  80 year old male with prostate cancer. Constant neck and spine pain. Abnormal cervical spine MRI last month. EXAM: MRI TOTAL SPINE WITHOUT AND  WITH CONTRAST TECHNIQUE: Multisequence MR imaging of the spine from the cervical spine to the sacrum was performed prior to and following IV contrast administration for evaluation of spinal metastatic disease. CONTRAST:  6 mL Gadavist COMPARISON:  Cervical spine MRI 08/12/2021. FINDINGS: MRI CERVICAL SPINE FINDINGS Alignment: Stable lordosis. Vertebrae: T1 hypointense metastatic disease throughout the C1, C2, C3 vertebrae. Vertebral bodies and posterior elements affected at those levels. Heterogeneous, subtotal involvement of C4 as demonstrated on the MRI last month. Comparatively mild C5 involvement. Subtotal C6 vertebral involvement may have progressed since last month. C7 relatively spared. 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 Bulky epidural tumor and spinal stenosis continue to the inferior C4 level. And there is associated mild stenosis and mass effect also at the cervicomedullary junction. But despite the mass effect there is lower brainstem or cervical cord edema identified. Tumor infiltrates the bilateral C3 and C4 neural foramina, surrounds the vertebral arteries at those levels. The prevertebral space is relatively spared. No abnormal intradural enhancement. Posterior Fossa, vertebral arteries, paraspinal tissues: No signal abnormality at the cervicomedullary junction. Negative visible posterior fossa otherwise. Disc levels: Stable degenerative disease. Mild degenerative spinal stenosis at C5-C6 and C6-C7. MRI THORACIC SPINE FINDINGS Alignment:  Preserved thoracic kyphosis. Vertebrae: Widespread osseous metastatic disease. Complete involvement of the T1 and T2 vertebrae. Moderate involvement of T3 (eccentric to the left). Patchy involvement of T4. Subtotal involvement of T5. Patchy involvement of T6 (eccentric to the left). Complete involvement of T7. Subtotal involvement  of T8. Near complete involvement of T9 and T10. Subtotal involvement of T11 and T12. Cord: There is mild ventral epidural tumor extension at the T10 level primarily on the left (series 18, image 10). The ventral cord is a faced there. But no thoracic cord compression or cord signal abnormality. No abnormal intradural enhancement or dural thickening. The conus medullaris is at T12-L1. Paraspinal and other soft tissues: No other thoracic extraosseous tumor extension identified. Disc levels: Intermittent thoracic degeneration, disc disease is most pronounced at T6-T7 (series 25, image 12) where the ventral cord is effaced but there is no significant spinal stenosis. MRI LUMBAR SPINE FINDINGS Segmentation:  Normal. Alignment: Preserved lumbar lordosis. There is subtle anterolisthesis of L4 on L5 which appears degenerative. Vertebrae: Widespread osseous metastatic disease. Widespread involvement of L1 and L2 eccentric to the right. Subtotal involvement of the L3 vertebral body. Widespread involvement of L4. Subtotal involvement of the L5 vertebra, with posterior element involvement on the right. Similar Patchy and confluent bilateral sacral metastatic disease. Conus medullaris: Extends to the T12-L1. Level  No lower spinal cord or conus signal abnormality. No abnormal intradural enhancement or dural thickening. Paraspinal and other soft tissues: Negative visible abdominal viscera. Diverticulosis of the distal large bowel. Disc levels: Superimposed lumbar spine degeneration maximal at L4-L5 where mild anterolisthesis is associated with moderate disc and posterior element degeneration resulting in moderate to severe degenerative spinal stenosis. IMPRESSION: 1. 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 Multiple vertebrae completely replaced by tumor. No pathologic fracture identified. 2. 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 Subsequent malignant cervical spinal  stenosis, including at the cervicomedullary junction. Mild cord compression, maximal at C2, but no spinal cord edema. Tumor infiltrates the bilateral C3 and C4 neural foramina. 3. Mild ventral epidural tumor at T10 eccentric to the left. No thoracic cord compression or cord signal abnormality. 4. Superimposed degenerative disease. 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 Electronically Signed   By: Genevie Ann M.D.   On: 08/23/2021 11:32   MR Lumbar Spine W Wo Contrast  Result Date: 08/23/2021 CLINICAL DATA:  80 year old male with prostate cancer. Constant neck and spine pain. Abnormal cervical spine MRI last month. EXAM: MRI TOTAL SPINE WITHOUT AND WITH CONTRAST TECHNIQUE: Multisequence MR imaging of the spine from the cervical spine to the sacrum was performed prior to and following IV contrast administration for evaluation of spinal metastatic disease. CONTRAST:  6 mL Gadavist COMPARISON:  Cervical spine MRI 08/12/2021. FINDINGS: MRI CERVICAL SPINE FINDINGS Alignment: Stable lordosis. Vertebrae: T1 hypointense metastatic disease throughout the C1, C2, C3 vertebrae. Vertebral bodies and posterior elements affected at those levels. Heterogeneous, subtotal involvement of C4 as demonstrated on the MRI last month. Comparatively mild C5 involvement. Subtotal C6 vertebral involvement may have progressed since last month. C7 relatively spared. 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 Bulky epidural tumor and spinal stenosis continue to the inferior C4 level. And there is associated mild stenosis and mass effect also at the cervicomedullary junction. But despite the mass effect there is lower brainstem or cervical cord edema identified. Tumor infiltrates the bilateral C3 and C4 neural foramina, surrounds the vertebral arteries at  those levels. The prevertebral space is relatively spared. No abnormal intradural enhancement. Posterior Fossa, vertebral arteries, paraspinal tissues: No signal abnormality at the cervicomedullary junction. Negative visible posterior fossa otherwise. Disc levels: Stable degenerative disease. Mild degenerative spinal stenosis at C5-C6 and C6-C7. MRI THORACIC SPINE FINDINGS Alignment:  Preserved thoracic kyphosis. Vertebrae: Widespread osseous metastatic disease. Complete involvement of the T1 and T2 vertebrae. Moderate involvement of T3 (eccentric to the left). Patchy involvement of T4. Subtotal involvement of T5. Patchy involvement of T6 (eccentric to the left). Complete involvement of T7. Subtotal involvement of T8. Near complete involvement of T9 and T10. Subtotal involvement of T11 and T12. Cord: There is mild ventral epidural tumor extension at the T10 level primarily on the left (series 18, image 10). The ventral cord is a faced there. But no thoracic cord compression or cord signal abnormality. No abnormal intradural enhancement or dural thickening. The conus medullaris is at T12-L1. Paraspinal and other soft tissues: No other thoracic extraosseous tumor extension identified. Disc levels: Intermittent thoracic degeneration, disc disease is most pronounced at T6-T7 (series 25, image 12) where the ventral cord is effaced but there is no significant spinal stenosis. MRI LUMBAR SPINE FINDINGS Segmentation:  Normal.  Alignment: Preserved lumbar lordosis. There is subtle anterolisthesis of L4 on L5 which appears degenerative. Vertebrae: Widespread osseous metastatic disease. Widespread involvement of L1 and L2 eccentric to the right. Subtotal involvement of the L3 vertebral body. Widespread involvement of L4. Subtotal involvement of the L5 vertebra, with posterior element involvement on the right. Similar Patchy and confluent bilateral sacral metastatic disease. Conus medullaris: Extends to the T12-L1. Level No  lower spinal cord or conus signal abnormality. No abnormal intradural enhancement or dural thickening. Paraspinal and other soft tissues: Negative visible abdominal viscera. Diverticulosis of the distal large bowel. Disc levels: Superimposed lumbar spine degeneration maximal at L4-L5 where mild anterolisthesis is associated with moderate disc and posterior element degeneration resulting in moderate to severe degenerative spinal stenosis. IMPRESSION: 1. 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 Multiple vertebrae completely replaced by tumor. No pathologic fracture identified. 2. 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 Subsequent malignant cervical spinal stenosis, including at the cervicomedullary junction. Mild cord compression, maximal at C2, but no spinal cord edema. Tumor infiltrates the bilateral C3 and C4 neural foramina. 3. Mild ventral epidural tumor at T10 eccentric to the left. No thoracic cord compression or cord signal abnormality. 4. Superimposed degenerative disease. 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 Electronically Signed   By: Genevie Ann M.D.   On: 08/23/2021 11:32   CT CHEST ABDOMEN PELVIS W CONTRAST  Result Date: 08/28/2021 CLINICAL DATA:  Osseous metastatic disease, history of prostate cancer, weight loss EXAM: CT CHEST, ABDOMEN, AND PELVIS WITH CONTRAST TECHNIQUE: Multidetector CT imaging of the chest, abdomen and pelvis was performed following the standard protocol during bolus administration of intravenous contrast. CONTRAST:  157mL OMNIPAQUE IOHEXOL 300 MG/ML SOLN, additional oral enteric contrast COMPARISON:  MR total spine, 08/23/2021 CT abdomen pelvis, 06/13/2012 FINDINGS: CT CHEST FINDINGS Cardiovascular: Aortic atherosclerosis. Normal heart size. Three-vessel coronary artery calcifications. No pericardial effusion. Mediastinum/Nodes: No enlarged mediastinal,  hilar, or axillary lymph nodes. Thyroid gland, trachea, and esophagus demonstrate no significant findings. Lungs/Pleura: Mild dependent bibasilar scarring and or partial atelectasis. No pleural effusion or pneumothorax. Musculoskeletal: No chest wall mass. CT ABDOMEN PELVIS FINDINGS Hepatobiliary: No solid liver abnormality is seen. No gallstones, gallbladder wall thickening, or biliary dilatation. Pancreas: Unremarkable. No pancreatic ductal dilatation or surrounding inflammatory changes. Spleen: Normal in size without significant abnormality. Adrenals/Urinary Tract: Adrenal glands are unremarkable. Kidneys are normal, without renal calculi, solid lesion, or hydronephrosis. Bladder is unremarkable. Stomach/Bowel: Stomach is within normal limits. Appendix appears normal. No evidence of bowel wall thickening, distention, or inflammatory changes. Sigmoid diverticulosis. Vascular/Lymphatic: Aortic atherosclerosis. No enlarged abdominal or pelvic lymph nodes. Reproductive: Prostatomegaly. Other: No abdominal wall hernia or abnormality. No abdominopelvic ascites. 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 No overtly lytic lesions identified. Left eccentric epidural soft tissue at T10 is only very subtly appreciated by CT (series 2, image 45). IMPRESSION: 1. 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 No overtly lytic lesions identified. 2. Left eccentric epidural soft tissue at T10 is only very subtly appreciated by CT. 3. No evidence of primary malignancy, lymphadenopathy or other soft tissue metastatic disease in the chest, abdomen, or pelvis. 4. Prostatomegaly. 5. Coronary  artery disease.  Aortic Atherosclerosis (ICD10-I70.0). Electronically Signed   By: Delanna Ahmadi M.D.   On: 08/28/2021 06:48   US Venous Img Lower Unilateral Left  Result Date: 09/02/2021 CLINICAL DATA:  Left leg pain and swelling for several days EXAM: LEFT LOWER EXTREMITY VENOUS DOPPLER ULTRASOUND TECHNIQUE: Gray-scale sonography with graded compression, as well as color Doppler and duplex ultrasound were performed to evaluate the lower extremity deep venous systems from the level of the common femoral vein and including the common femoral, femoral, profunda femoral, popliteal and calf veins including the posterior tibial, peroneal and gastrocnemius veins when visible. The superficial great saphenous vein was also interrogated. Spectral Doppler was utilized to evaluate flow at rest and with distal augmentation maneuvers in the common femoral, femoral and popliteal veins. COMPARISON:  None. FINDINGS: Contralateral Common Femoral Vein: Respiratory phasicity is normal and symmetric with the symptomatic side. No evidence of thrombus. Normal compressibility. Common Femoral Vein: No evidence of thrombus. Normal compressibility, respiratory phasicity and response to augmentation. Saphenofemoral Junction: No evidence of thrombus. Normal compressibility and flow on color Doppler imaging. Profunda Femoral Vein: No evidence of thrombus. Normal compressibility and flow on color Doppler imaging. Femoral Vein: No evidence of thrombus. Normal compressibility, respiratory phasicity and response to augmentation. Popliteal Vein: No evidence of thrombus. Normal compressibility, respiratory phasicity and response to augmentation. Calf Veins: No evidence of thrombus. Normal compressibility and flow on color Doppler imaging. Superficial Great Saphenous Vein: No evidence of thrombus. Normal compressibility. Venous Reflux:  None. Other Findings: Complex fluid collection is noted in the left popliteal fossa measuring 2.3 x 1.6 cm  consistent with a popliteal cyst. IMPRESSION: No evidence of deep venous thrombosis. Small popliteal cyst. Electronically Signed   By: Inez Catalina M.D.   On: 09/02/2021 02:31    Assessment and plan-   #Iron deficiency anemia, status post 1 unit of PRBC transfusion.  Status post 2 doses of IV Venofer treatments. Today's hemoglobin was 8.7. We will schedule patient to get additional IV Venofer treatments outpatient.  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  Patient will follow-up with me outpatient to go over results.  #Widespread bone metastasis. Patient has no focal deficit.  Patient was seen previously by neurosurgeon. Not on steroid.  Patient knows about red flag symptoms of cord compression and he knows to go to emergency room if such symptoms occur.   Thank you for allowing me to participate in the care of this patient.  Plan was discussed with hospitalist.  Earlie Server, MD, PhD 09/03/2021

## 2021-09-03 NOTE — Op Note (Signed)
[facility] Gastroenterology Patient Name: Stuart Mcneil Procedure Date: 09/03/2021 2:20 PM MRN: [id number] Account #: [id number] Date of Birth: 1941-08-15 Admit Type: Outpatient Age: 80 Room: [facility] ENDO ROOM 1 Gender: Male Note Status: Finalized Instrument Name: Upper Endoscope [id number] Procedure:             Upper GI endoscopy Indications:           Dysphagia, Suspected esophageal reflux, Abnormal CT of                         the GI tract Providers:             Benay Pike. Shikita Vaillancourt MD, MD Medicines:             Propofol per Anesthesia Complications:         No immediate complications. Procedure:             Pre-Anesthesia Assessment:                        - The risks and benefits of the procedure and the                         sedation options and risks were discussed with the                         patient. All questions were answered and informed                         consent was obtained.                        - Patient identification and proposed procedure were                         verified prior to the procedure by the nurse. The                         procedure was verified in the procedure room.                        - ASA Grade Assessment: III - A patient with severe                         systemic disease.                        - After reviewing the risks and benefits, the patient                         was deemed in satisfactory condition to undergo the                         procedure.                        After obtaining informed consent, the endoscope was                         passed under direct vision. Throughout the procedure,  the patient's blood pressure, pulse, and oxygen                         saturations were monitored continuously. The Endoscope                         was introduced through the mouth, and advanced to the                         third part of duodenum. The upper GI endoscopy was                          accomplished without difficulty. The patient tolerated                         the procedure well. Findings:      A medium-sized, ulcerating mass with no bleeding and no stigmata of       recent bleeding was found in the distal esophagus, 34 to 38 cm from the       incisors. The mass was partially obstructing and partially       circumferential (involving one-half of the lumen circumference).       Biopsies were taken with a cold forceps for histology.      A single 8 mm mucosal papule (nodule) with no bleeding and no stigmata       of recent bleeding was found in the gastric body. Biopsies were taken       with a cold forceps for histology.      The examined duodenum was normal.      The exam was otherwise without abnormality. 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 Biopsied.                        - A single mucosal papule (nodule) found in the                         stomach. Biopsied.                        - Normal examined duodenum.                        - The examination was otherwise normal. Recommendation:        - Await pathology results.                        - Proceed with colonoscopy Procedure Code(s):     --- Professional ---                        [phone], Esophagogastroduodenoscopy, flexible,                         transoral; with biopsy, single or multiple Diagnosis Code(s):     --- Professional ---  R93.3, Abnormal findings on diagnostic imaging of                         other parts of digestive tract                        R13.10, Dysphagia, unspecified                        K31.89, Other diseases of stomach and duodenum                        D49.0, Neoplasm of unspecified behavior of digestive                         system CPT copyright 2019 American Medical Association. All rights reserved. The codes documented in this  report are preliminary and upon coder review may  be revised to meet current compliance requirements. Efrain Sella MD, MD 09/03/2021 2:57:11 PM This report has been signed electronically. Number of Addenda: 0 Note Initiated On: 09/03/2021 2:20 PM Estimated Blood Loss:  Estimated blood loss was minimal.      [facility]

## 2021-09-03 NOTE — Interval H&P Note (Signed)
History and Physical Interval Note:  09/03/2021 2:38 PM  Stuart Mcneil  has presented today for surgery, with the diagnosis of Symptomatic anemia, unintentional weight loss, esophageal dysphagia.  The various methods of treatment have been discussed with the patient and family. After consideration of risks, benefits and other options for treatment, the patient has consented to  Procedure(s): ESOPHAGOGASTRODUODENOSCOPY (EGD) (N/A) COLONOSCOPY (N/A) as a surgical intervention.  The patient's history has been reviewed, patient examined, no change in status, stable for surgery.  I have reviewed the patient's chart and labs.  Questions were answered to the patient's satisfaction.     [place], [place]

## 2021-09-03 NOTE — Op Note (Signed)
[facility] Gastroenterology Patient Name: Stuart Mcneil Procedure Date: 09/03/2021 2:19 PM MRN: [id number] Account #: [id number] Date of Birth: 1940/11/02 Admit Type: Outpatient Age: 80 Room: [facility] ENDO ROOM 1 Gender: Male Note Status: Finalized Instrument Name: Jasper Riling [id number] Procedure:             Colonoscopy Indications:           Abnormal CT of the GI tract Providers:             Benay Pike. Niaja Stickley MD, MD Medicines:             Propofol per Anesthesia Complications:         No immediate complications. Procedure:             Pre-Anesthesia Assessment:                        - The risks and benefits of the procedure and the                         sedation options and risks were discussed with the                         patient. All questions were answered and informed                         consent was obtained.                        - Patient identification and proposed procedure were                         verified prior to the procedure by the nurse. The                         procedure was verified in the procedure room.                        - ASA Grade Assessment: III - A patient with severe                         systemic disease.                        - After reviewing the risks and benefits, the patient                         was deemed in satisfactory condition to undergo the                         procedure.                        After obtaining informed consent, the colonoscope was                         passed under direct vision. Throughout the procedure,                         the patient's blood pressure, pulse, and oxygen  saturations were monitored continuously. The                         Colonoscope was introduced through the anus and                         advanced to the the cecum, identified by appendiceal                         orifice and ileocecal valve. The colonoscopy was                          performed without difficulty. The patient tolerated                         the procedure well. The quality of the bowel                         preparation was adequate. The ileocecal valve,                         appendiceal orifice, and rectum were photographed. Findings:      The perianal and digital rectal examinations were normal. Pertinent       negatives include normal sphincter tone and no palpable rectal lesions.      Multiple small and large-mouthed diverticula were found in the sigmoid       colon. There was no evidence of diverticular bleeding.      Non-bleeding internal hemorrhoids were found during retroflexion. The       hemorrhoids were Grade I (internal hemorrhoids that do not prolapse).      The exam was otherwise without abnormality. Impression:            - Mild diverticulosis in the sigmoid colon. There was                         no evidence of diverticular bleeding.                        - Non-bleeding internal hemorrhoids.                        - The examination was otherwise normal.                        - No specimens collected. Recommendation:        - Await pathology results from EGD, also performed                         today.                        - Return patient to hospital ward for ongoing care.                        - Advance diet as tolerated.                        - Continue present medications.                        -  The findings and recommendations were discussed with                         the patient. Procedure Code(s):     --- Professional ---                        [phone], Colonoscopy, flexible; diagnostic, including                         collection of specimen(s) by brushing or washing, when                         performed (separate procedure) Diagnosis Code(s):     --- Professional ---                        R93.3, Abnormal findings on diagnostic imaging of                         other parts of digestive tract                         K57.30, Diverticulosis of large intestine without                         perforation or abscess without bleeding                        K64.0, First degree hemorrhoids CPT copyright 2019 American Medical Association. All rights reserved. The codes documented in this report are preliminary and upon coder review may  be revised to meet current compliance requirements. Efrain Sella MD, MD 09/03/2021 3:10:36 PM This report has been signed electronically. Number of Addenda: 0 Note Initiated On: 09/03/2021 2:19 PM Scope Withdrawal Time: 0 hours 4 minutes 32 seconds  Total Procedure Duration: 0 hours 8 minutes 21 seconds  Estimated Blood Loss:  Estimated blood loss: none.      [facility]

## 2021-09-03 NOTE — Transfer of Care (Signed)
Immediate Anesthesia Transfer of Care Note  Patient: Stuart Mcneil  Procedure(s) Performed: ESOPHAGOGASTRODUODENOSCOPY (EGD) COLONOSCOPY  Patient Location: PACU  Anesthesia Type:General  Level of Consciousness: sedated  Airway & Oxygen Therapy: Patient Spontanous Breathing  Post-op Assessment: Report given to RN and Post -op Vital signs reviewed and stable  Post vital signs: Reviewed and stable  Last Vitals:  Vitals Value Taken Time  BP 128/61 09/03/21 1513  Temp 36.4 C 09/03/21 1511  Pulse 81 09/03/21 1513  Resp 19 09/03/21 1513  SpO2 94 % 09/03/21 1513    Last Pain:  Vitals:   09/03/21 1511  TempSrc: Temporal  PainSc: Asleep      Patients Stated Pain Goal: 0 (89/78/47 8412)  Complications: No notable events documented.

## 2021-09-03 NOTE — Telephone Encounter (Signed)
Schedule patient for venofer the week of 11/21.   Lab/MD/ venofer the week of 11/28 (cbc, retic panel, CEA). Please inform pt of appts.

## 2021-09-03 NOTE — Anesthesia Procedure Notes (Signed)
Date/Time: 09/03/2021 2:39 PM Performed by: Johnna Acosta, CRNA Pre-anesthesia Checklist: Patient identified, Emergency Drugs available, Suction available, Patient being monitored and Timeout performed Patient Re-evaluated:Patient Re-evaluated prior to induction Oxygen Delivery Method: Nasal cannula Preoxygenation: Pre-oxygenation with 100% oxygen Induction Type: IV induction

## 2021-09-03 NOTE — Plan of Care (Signed)

## 2021-09-03 NOTE — Anesthesia Postprocedure Evaluation (Signed)
Anesthesia Post Note  Patient: Stuart Mcneil  Procedure(s) Performed: ESOPHAGOGASTRODUODENOSCOPY (EGD) COLONOSCOPY  Patient location during evaluation: Endoscopy Anesthesia Type: General Level of consciousness: awake and alert Pain management: pain level controlled Vital Signs Assessment: post-procedure vital signs reviewed and stable Respiratory status: spontaneous breathing, nonlabored ventilation, respiratory function stable and patient connected to nasal cannula oxygen Cardiovascular status: blood pressure returned to baseline and stable Postop Assessment: no apparent nausea or vomiting Anesthetic complications: no   No notable events documented.   Last Vitals:  Vitals:   09/03/21 1513 09/03/21 1531  BP: 128/61 118/60  Pulse: 81   Resp: 19   Temp:    SpO2: 94%     Last Pain:  Vitals:   09/03/21 1531  TempSrc:   PainSc: 0-No pain                 Precious Haws Suhani Stillion

## 2021-09-04 ENCOUNTER — Other Ambulatory Visit: Payer: Self-pay | Admitting: Oncology

## 2021-09-04 ENCOUNTER — Encounter: Payer: Self-pay | Admitting: Radiology

## 2021-09-04 ENCOUNTER — Encounter: Payer: Self-pay | Admitting: Oncology

## 2021-09-04 ENCOUNTER — Other Ambulatory Visit: Payer: Self-pay

## 2021-09-04 ENCOUNTER — Emergency Department: Payer: Medicare Other

## 2021-09-04 ENCOUNTER — Emergency Department
Admission: EM | Admit: 2021-09-04 | Discharge: 2021-09-16 | Disposition: E | Payer: Medicare Other | Attending: Emergency Medicine | Admitting: Emergency Medicine

## 2021-09-04 DIAGNOSIS — K2289 Other specified disease of esophagus: Secondary | ICD-10-CM

## 2021-09-04 DIAGNOSIS — D500 Iron deficiency anemia secondary to blood loss (chronic): Secondary | ICD-10-CM

## 2021-09-04 DIAGNOSIS — Z87891 Personal history of nicotine dependence: Secondary | ICD-10-CM | POA: Insufficient documentation

## 2021-09-04 DIAGNOSIS — D509 Iron deficiency anemia, unspecified: Secondary | ICD-10-CM

## 2021-09-04 DIAGNOSIS — K922 Gastrointestinal hemorrhage, unspecified: Secondary | ICD-10-CM | POA: Insufficient documentation

## 2021-09-04 DIAGNOSIS — K229 Disease of esophagus, unspecified: Secondary | ICD-10-CM | POA: Diagnosis present

## 2021-09-04 HISTORY — DX: Iron deficiency anemia, unspecified: D50.9

## 2021-09-04 LAB — KAPPA/LAMBDA LIGHT CHAINS

## 2021-09-04 IMAGING — DX DG CHEST 1V PORT
1 series · 1 of 1 positions shown · non-contrast
Comparison: Chest x-ray [DATE], CT chest [DATE]

CLINICAL DATA: Shortness of breath

EXAM:
PORTABLE CHEST 1 VIEW

[chest ap]
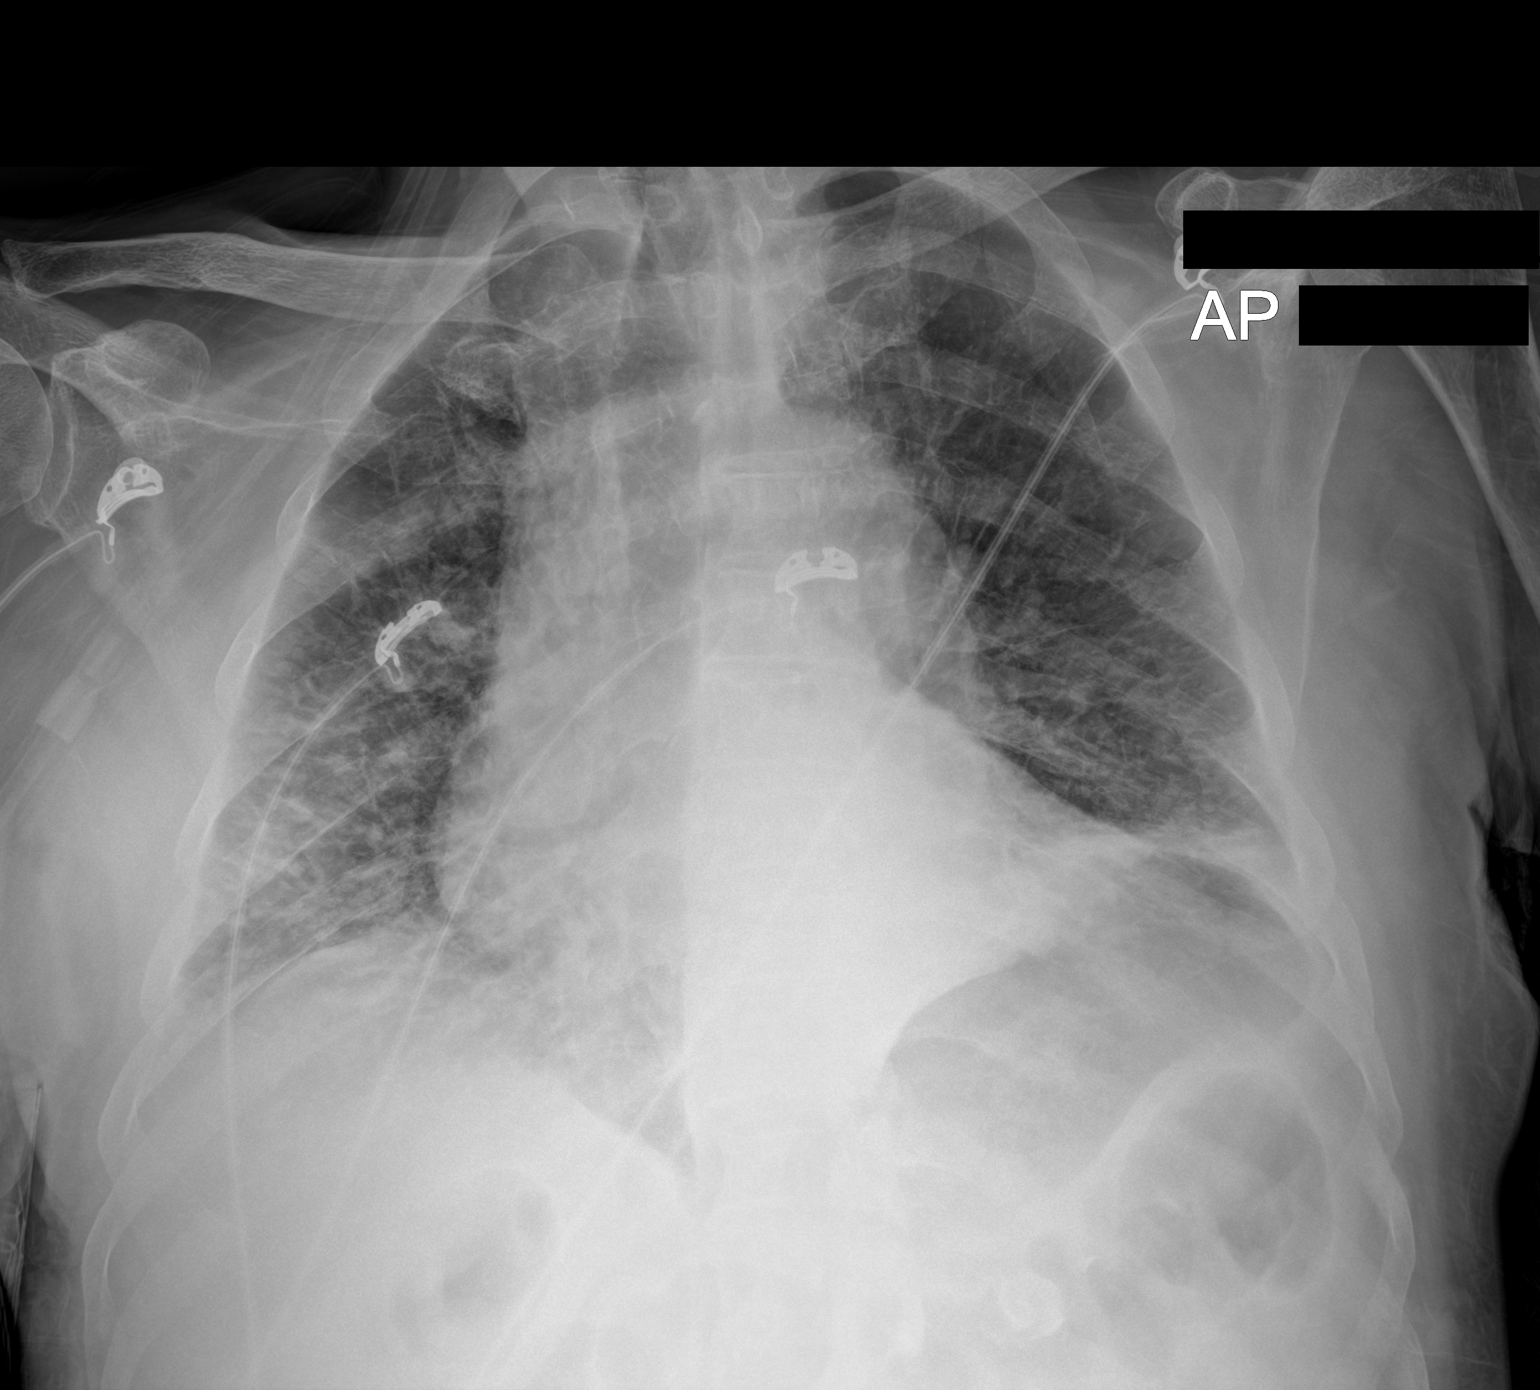

[1 of 1 positions shown; findings below may reference images not displayed]

FINDINGS: Enlarged cardiac and aortic arch silhouette due to patient rotation
AP portable technique. Otherwise the heart and mediastinal contours
are within normal limits. Aortic calcification.

Left base streaky airspace opacity likely representing atelectasis.
No focal consolidation. Interval development of increased
interstitial markings. No pleural effusion. No pneumothorax.

No acute osseous abnormality.
IMPRESSION: Pulmonary edema. Superimposed infection/inflammation not excluded.
Limited evaluation due to patient rotation. Recommend repeat frontal
PA and lateral view of the chest.

## 2021-09-04 MED ORDER — SODIUM CHLORIDE 0.9 % IV SOLN
10.0000 mL/h | Freq: Once | INTRAVENOUS | Status: AC
  Administered 2021-09-04: 10 mL/h via INTRAVENOUS

## 2021-09-04 MED ORDER — LACTATED RINGERS IV BOLUS
1000.0000 mL | Freq: Once | INTRAVENOUS | Status: AC
  Administered 2021-09-04: 1000 mL via INTRAVENOUS

## 2021-09-04 MED ORDER — PANTOPRAZOLE SODIUM 40 MG IV SOLR: 40.0000 mg | Freq: Once | INTRAVENOUS | Status: DC

## 2021-09-04 MED ORDER — MORPHINE SULFATE (PF) 2 MG/ML IV SOLN
INTRAVENOUS | Status: AC
  Administered 2021-09-04: 2 mg via INTRAVENOUS
  Filled 2021-09-04: qty 1

## 2021-09-04 MED ORDER — SODIUM CHLORIDE 0.9% IV SOLUTION
Freq: Once | INTRAVENOUS | Status: AC
  Filled 2021-09-04: qty 250

## 2021-09-05 LAB — TYPE AND SCREEN
Unit division: 0
Unit division: 0

## 2021-09-05 LAB — BPAM RBC
Blood Product Expiration Date: 202212132359
Blood Product Expiration Date: 202212132359
ISSUE DATE / TIME: 202211191943
ISSUE DATE / TIME: 202211191943
Unit Type and Rh: 5100
Unit Type and Rh: 5100

## 2021-09-05 LAB — PREPARE RBC (CROSSMATCH)

## 2021-09-06 ENCOUNTER — Encounter: Payer: Self-pay | Admitting: Internal Medicine

## 2021-09-06 LAB — SURGICAL PATHOLOGY

## 2021-09-06 NOTE — Telephone Encounter (Signed)
Patient is deceased.

## 2021-09-07 ENCOUNTER — Telehealth: Payer: Self-pay | Admitting: *Deleted

## 2021-09-07 NOTE — Telephone Encounter (Signed)
This was done while inpatient and ordered by Dr. Alice Reichert, so they will need to contact medical records for a copy of the path report. Med rec # [phone]

## 2021-09-07 NOTE — Telephone Encounter (Signed)
Son Gerald Stabs called reporting that patient expired over the weekend in the ER and is asking if they can get to path diagnosis on this patient.  Notes Component 4 d ago   SURGICAL PATHOLOGY SURGICAL PATHOLOGY  CASE: [id number]  PATIENT: Stuart Mcneil  Surgical Pathology Report      Specimen Submitted:  A. Stomach body nodule; cbx  B. Esophagus mass, distal; cbx   Clinical History: Symptomatic anemia, unintentional weight loss,  esophageal dysphagia. Gastric body nodule, distal esophagus mass,  internal hemorrhoids, diverticulosis.       DIAGNOSIS:  A. STOMACH BODY NODULE; COLD BIOPSY:  - HYPERPLASTIC POLYP.  - NEGATIVE FOR DYSPLASIA AND MALIGNANCY.   B. ESOPHAGUS MASS, DISTAL; COLD BIOPSY:  - HIGH-GRADE CARCINOMA WITH NEUROENDOCRINE MORPHOLOGY, INVOLVING  SQUAMOUS-LINED MUCOSA.  - SEE COMMENT.   Comment:  Given the patient's demise, no workup will be performed to further  characterize the tumor.      GROSS DESCRIPTION:  A. Labeled: Gastric body nodule cbx rule out GIST  Received: Formalin  Collection time: 2:47 PM on 09/03/2021  Placed into formalin time: 2:47 PM on 09/03/2021  Tissue fragment(s): 1  Size: 0.4 x 0.4 x 0.2 cm  Description: Tan soft tissue fragment  Entirely submitted in 1 cassette.   B. Labeled: Distal esophagus mass cbx  Received: Formalin  Collection time: 2:48 PM on 09/03/2021  Placed into formalin time: 2:48 PM on 09/03/2021  Tissue fragment(s): Multiple  Size: Aggregate, 1 x 0.5 x 0.2 cm  Description: Tan-white soft tissue fragments  Entirely submitted in 1 cassette.   RB 09/03/2021   Final Diagnosis performed by Betsy Pries, MD.   Electronically signed  09/06/2021 9:07:24AM  The electronic signature indicates that the named Attending Pathologist  has evaluated the specimen  Technical component performed at [facility], [address] [place],  [place] [address] Lab: [phone] Dir: Rush Farmer, MD, MMM   Professional component  performed at [facility], [facility], [place], [place], [place] [address] Lab: [phone]  Dir: Kathi Simpers, MD   Resulting Agency [facility]         Specimen Collected: 09/03/21 14:47 Last Resulted: 09/06/21 09:24

## 2021-09-08 NOTE — Discharge Summary (Signed)
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 Hb dropped from 13 to 7.  Started on 2 PRBC transfusion.  Gastroenterology was consulted Underwent EGD and colonoscopy.  Hb remained stable after initial transfusion. 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 Biopsied. Colonoscopy was unremarkable.  Pt also received IV venofer x2 prior to discharge.  Per GI pt can be discharged home. Pt was tolerating oral diet.   Widespread metastatic bone lesions. CT chest abdomen pelvis with contrast did not reveal primary malignancy, lymphadenopathy or other soft tissue metastatic disease.  Bone lesions.  Prostatectomy, coronary artery disease. Pt establish with Oncology. Will follow up with them for iV iron therapy and further discussion on management after biopsy results. Will increase pain management as pt's pain is not adequately controlled on bid norco.   GERD. continue PPI therapy.  Osteoarthritis. hold off NSAIDs for now.  Rheumatoid arthritis. the patient is on Enbrel.  Overactive bladder. We will continue her Myrbetriq  Procedures performed: EGD, colonoscopy  Condition at discharge: good  Exam General: Appear in mild distress, no Rash; Oral Mucosa Clear, moist. no Abnormal Neck Mass Or lumps, Conjunctiva normal  Cardiovascular: S1 and S2 Present, no Murmur, Respiratory: good respiratory effort, Bilateral Air entry present and CTA, no Crackles, no  wheezes Abdomen: Bowel Sound present, Soft and no tenderness Extremities: no Pedal edema Neurology: alert and oriented to time, place, and person affect appropriate. no new focal deficit Gait not checked due to patient safety concerns    Disposition: Home  Discharge time: greater than 30 minutes.  Follow-up Information     Bella Kennedy, MD. Schedule an appointment as soon as possible for a visit in 1 week(s).   Specialty: Neurology Why: Patient to make own follow up appointment; office closed Contact information: [place] [address] [phone]         Earlie Server, MD Follow up.   Specialty: Oncology Why: Patient to make own appointment; office closed Contact information: [place] [address] [phone]                 Allergies as of 09/03/2021   No Known Allergies      Medication List     STOP taking these medications    Glucosamine-Chondroitin 500-400 MG Caps   methotrexate 2.5 MG tablet Commonly known as: RHEUMATREX       TAKE these medications    diazepam 2 MG tablet Commonly known as: VALIUM Take 2 mg by mouth every 12 (twelve) hours as needed for pain.   Ensure Nutrition Shake Liqd Take 1 each by mouth 3 (three) times daily before meals.   etanercept 50 MG/ML injection Commonly known as: ENBREL 50 mg once a week.   folic acid 1 MG tablet Commonly known as: FOLVITE Take 1 mg by mouth daily.   HYDROcodone-acetaminophen 5-325 MG tablet Commonly known as: NORCO/VICODIN Take 1 tablet by mouth every 8 (eight) hours as needed for moderate  pain or severe pain. What changed:  when to take this reasons to take this   mirabegron ER 50 MG Tb24 tablet Commonly known as: MYRBETRIQ Take 1 tablet (50 mg total) by mouth daily.   MULTIVITAMIN ADULT PO Take by mouth daily.   omeprazole 20 MG capsule Commonly known as: PRILOSEC Take 20 mg by mouth daily.   sildenafil 100 MG tablet Commonly  known as: VIAGRA Take 1 tablet (100 mg total) by mouth daily as needed for erectile dysfunction.   tadalafil 5 MG tablet Commonly known as: CIALIS Take one tablet daily for benign prostatic hyperplasia   tiZANidine 4 MG tablet Commonly known as: ZANAFLEX Take 2 mg by mouth 3 (three) times daily as needed for muscle spasms.        DG Chest 2 View  Result Date: 09/01/2021 CLINICAL DATA:  Shortness of breath. EXAM: CHEST - 2 VIEW COMPARISON:  Chest CT 08/27/2021 FINDINGS: The heart is normal in size. Normal mediastinal contours. Slight increase in left lung base atelectasis from recent CT. No confluent consolidation. No pleural effusion, pneumothorax, or pulmonary edema. Patient with known osseous metastatic disease, lesions not well seen on the current exam. IMPRESSION: Slight increase in left lung base atelectasis from recent CT. Electronically Signed   By: Keith Rake M.D.   On: 09/01/2021 19:58   DG Ankle Complete Left  Result Date: 09/02/2021 CLINICAL DATA:  Ankle injury 1 week ago with persistent pain, initial encounter EXAM: LEFT ANKLE COMPLETE - 3+ VIEW COMPARISON:  None. FINDINGS: Very mild lateral soft tissue swelling is noted. No acute fracture or dislocation is seen. IMPRESSION: Lateral soft tissue swelling without acute bony abnormality. Electronically Signed   By: Inez Catalina M.D.   On: 09/02/2021 00:51   CT Angio Chest PE W and/or Wo Contrast  Result Date: 09/02/2021 CLINICAL DATA:  Shortness of breath EXAM: CT ANGIOGRAPHY CHEST WITH CONTRAST TECHNIQUE: Multidetector CT imaging of the chest was performed using the standard protocol during bolus administration of intravenous contrast. Multiplanar CT image reconstructions and MIPs were obtained to evaluate the vascular anatomy. CONTRAST:  12mL OMNIPAQUE IOHEXOL 350 MG/ML SOLN COMPARISON:  Chest x-ray from the previous day, CT from 08/27/2021. FINDINGS: Cardiovascular: Thoracic aorta demonstrates atherosclerotic  calcifications without aneurysmal dilatation or dissection. No cardiac enlargement is noted. Coronary calcifications are seen. Pulmonary artery shows a normal branching pattern. No filling defect to suggest pulmonary embolism is noted. Mediastinum/Nodes: Thoracic inlet is within normal limits. No sizable hilar or mediastinal adenopathy is noted. The esophagus is air-filled and patulous. Lungs/Pleura: Lungs are well aerated bilaterally. Left lower lobe atelectasis is seen similar to that noted on recent chest x-ray. No sizable effusion is noted. No focal infiltrate on the right is noted. No parenchymal nodules are seen. Upper Abdomen: Visualized upper abdomen is within normal limits. Musculoskeletal: Subtle areas of increased sclerosis are noted throughout the thoracic spine consistent with the known history of metastatic disease. Similar findings are noted within the ribcage. Review of the MIP images confirms the above findings. IMPRESSION: No evidence of pulmonary emboli. Scattered mild subtle areas of increased sclerosis consistent with known metastatic disease within the thoracic spine and to a lesser degree in the ribcage. Slight increase in left lower lobe atelectasis when compare with the prior CT similar to that seen on recent chest x-ray. Aortic Atherosclerosis (ICD10-I70.0). Electronically Signed   By: Inez Catalina M.D.   On: 09/02/2021 03:01   MR CERVICAL SPINE WO CONTRAST  Result Date: 08/14/2021 CLINICAL DATA:  Neck pain radiating into both arms for 1 month. No known injury. History of arthritis. Reported C2 sclerosis on outside radiographs; no given history of malignancy. EXAM: MRI CERVICAL SPINE WITHOUT CONTRAST TECHNIQUE: Multiplanar, multisequence MR imaging of the cervical spine was performed. No intravenous contrast was administered. COMPARISON:  Report only from outside radiographs 08/04/2021. Abdominopelvic CT 06/13/2012. FINDINGS: Alignment: Physiologic. Vertebrae: There is diffuse  replacement of the normal marrow signal within the C2 vertebral body and its posterior elements, best seen on the T1 weighted images. There is similar diffuse replacement of the normal marrow signal within the C3 vertebral body, and other smaller lesions are present throughout the cervical and upper thoracic spine. Findings are consistent with diffuse metastatic disease. There is associated significant anterior epidural tumor anteriorly from C2 through C4. No significant pathologic fracture identified. Cord: The CSF surrounding the cord is effaced by the epidural tumor at C2. No definite cord compression or abnormal cord signal. Posterior Fossa, vertebral arteries, paraspinal tissues: Visualized portions of the posterior fossa appear unremarkable. No significant paraspinal findings. Bilateral vertebral artery flow voids. Disc levels: C2-3: The disc appears normal. As above, there is significant anterior epidural tumor bilaterally related to the extensive metastatic disease involving the C2 and C3 vertebral bodies. There is effacement of the CSF surrounding the cord as well as foraminal extension of epidural tumor bilaterally. C3-4: The disc appears normal. There is lesser anterior epidural tumor at this level with biforaminal extension of tumor. No cord deformity. C4-5: Mild disc bulging and uncinate spurring asymmetric to the right. Mild right foraminal narrowing. No cord deformity. C5-6: Spondylosis with loss of disc height, uncinate spurring and bilateral facet hypertrophy. Resulting mild spinal stenosis with left greater than right foraminal narrowing. C6-7: Mild disc bulging and facet hypertrophy. No significant spinal stenosis or nerve root encroachment. C7-T1: Mild bilateral facet hypertrophy. No significant spinal stenosis or nerve root encroachment. IMPRESSION: 1. 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 Given reported sclerosis on prior radiographs, consider metastatic  prostate cancer. Correlate with serum PSA levels. 2. There is significant associated anterior epidural tumor from C2 through C4 with associated mass effect on the thecal sac and biforaminal extension of tumor at C2-3 and C3-4. These findings place the patient at risk for cord compression which is not definitely demonstrated at this time. Neurosurgical evaluation recommended. 3. Relatively mild spondylosis, greatest at C5-6 and C6-7. 4. These results will be called to the ordering clinician or representative by the Radiologist Assistant, and communication documented in the PACS or [employer]. Electronically Signed   By: Richardean Sale M.D.   On: 08/14/2021 09:51   MR CERVICAL SPINE W WO CONTRAST  Result Date: 08/23/2021 CLINICAL DATA:  80 year old male with prostate cancer. Constant neck and spine pain. Abnormal cervical spine MRI last month. EXAM: MRI TOTAL SPINE WITHOUT AND WITH CONTRAST TECHNIQUE: Multisequence MR imaging of the spine from the cervical spine to the sacrum was performed prior to and following IV contrast administration for evaluation of spinal metastatic disease. CONTRAST:  6 mL Gadavist COMPARISON:  Cervical spine MRI 08/12/2021. FINDINGS: MRI CERVICAL SPINE FINDINGS Alignment: Stable lordosis. Vertebrae: T1 hypointense metastatic disease throughout the C1, C2, C3 vertebrae. Vertebral bodies and posterior elements affected at those levels. Heterogeneous, subtotal involvement of C4 as demonstrated on the MRI last month. Comparatively mild C5 involvement. Subtotal C6 vertebral involvement may have progressed since last month. C7 relatively spared. 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 Extensive epidural tumor results in  malignant spinal stenosis with mild cord compression on series 17, image 15. Bulky epidural tumor and spinal stenosis continue to the inferior C4 level. And there is associated mild stenosis and mass effect also at the cervicomedullary junction. But  despite the mass effect there is lower brainstem or cervical cord edema identified. Tumor infiltrates the bilateral C3 and C4 neural foramina, surrounds the vertebral arteries at those levels. The prevertebral space is relatively spared. No abnormal intradural enhancement. Posterior Fossa, vertebral arteries, paraspinal tissues: No signal abnormality at the cervicomedullary junction. Negative visible posterior fossa otherwise. Disc levels: Stable degenerative disease. Mild degenerative spinal stenosis at C5-C6 and C6-C7. MRI THORACIC SPINE FINDINGS Alignment:  Preserved thoracic kyphosis. Vertebrae: Widespread osseous metastatic disease. Complete involvement of the T1 and T2 vertebrae. Moderate involvement of T3 (eccentric to the left). Patchy involvement of T4. Subtotal involvement of T5. Patchy involvement of T6 (eccentric to the left). Complete involvement of T7. Subtotal involvement of T8. Near complete involvement of T9 and T10. Subtotal involvement of T11 and T12. Cord: There is mild ventral epidural tumor extension at the T10 level primarily on the left (series 18, image 10). The ventral cord is a faced there. But no thoracic cord compression or cord signal abnormality. No abnormal intradural enhancement or dural thickening. The conus medullaris is at T12-L1. Paraspinal and other soft tissues: No other thoracic extraosseous tumor extension identified. Disc levels: Intermittent thoracic degeneration, disc disease is most pronounced at T6-T7 (series 25, image 12) where the ventral cord is effaced but there is no significant spinal stenosis. MRI LUMBAR SPINE FINDINGS Segmentation:  Normal. Alignment: Preserved lumbar lordosis. There is subtle anterolisthesis of L4 on L5 which appears degenerative. Vertebrae: Widespread osseous metastatic disease. Widespread involvement of L1 and L2 eccentric to the right. Subtotal involvement of the L3 vertebral body. Widespread involvement of L4. Subtotal involvement of the L5  vertebra, with posterior element involvement on the right. Similar Patchy and confluent bilateral sacral metastatic disease. Conus medullaris: Extends to the T12-L1. Level No lower spinal cord or conus signal abnormality. No abnormal intradural enhancement or dural thickening. Paraspinal and other soft tissues: Negative visible abdominal viscera. Diverticulosis of the distal large bowel. Disc levels: Superimposed lumbar spine degeneration maximal at L4-L5 where mild anterolisthesis is associated with moderate disc and posterior element degeneration resulting in moderate to severe degenerative spinal stenosis. IMPRESSION: 1. 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 Multiple vertebrae completely replaced by tumor. No pathologic fracture identified. 2. 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 Subsequent malignant cervical spinal stenosis, including at the cervicomedullary junction. Mild cord compression, maximal at C2, but no spinal cord edema. Tumor infiltrates the bilateral C3 and C4 neural foramina. 3. Mild ventral epidural tumor at T10 eccentric to the left. No thoracic cord compression or cord signal abnormality. 4. Superimposed degenerative disease. 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 Electronically Signed   By: Genevie Ann M.D.   On: 08/23/2021 11:32   MR THORACIC SPINE W WO CONTRAST  Result Date: 08/23/2021 CLINICAL DATA:  80 year old male with prostate cancer. Constant neck and spine pain. Abnormal cervical spine MRI last month. EXAM: MRI TOTAL SPINE WITHOUT AND WITH CONTRAST TECHNIQUE: Multisequence MR imaging of the spine from the cervical spine to the sacrum was performed prior to and following IV contrast administration for evaluation of spinal metastatic disease. CONTRAST:  6 mL Gadavist COMPARISON:  Cervical spine MRI 08/12/2021. FINDINGS: MRI  CERVICAL SPINE FINDINGS Alignment:  Stable lordosis. Vertebrae: T1 hypointense metastatic disease throughout the C1, C2, C3 vertebrae. Vertebral bodies and posterior elements affected at those levels. Heterogeneous, subtotal involvement of C4 as demonstrated on the MRI last month. Comparatively mild C5 involvement. Subtotal C6 vertebral involvement may have progressed since last month. C7 relatively spared. 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 Bulky epidural tumor and spinal stenosis continue to the inferior C4 level. And there is associated mild stenosis and mass effect also at the cervicomedullary junction. But despite the mass effect there is lower brainstem or cervical cord edema identified. Tumor infiltrates the bilateral C3 and C4 neural foramina, surrounds the vertebral arteries at those levels. The prevertebral space is relatively spared. No abnormal intradural enhancement. Posterior Fossa, vertebral arteries, paraspinal tissues: No signal abnormality at the cervicomedullary junction. Negative visible posterior fossa otherwise. Disc levels: Stable degenerative disease. Mild degenerative spinal stenosis at C5-C6 and C6-C7. MRI THORACIC SPINE FINDINGS Alignment:  Preserved thoracic kyphosis. Vertebrae: Widespread osseous metastatic disease. Complete involvement of the T1 and T2 vertebrae. Moderate involvement of T3 (eccentric to the left). Patchy involvement of T4. Subtotal involvement of T5. Patchy involvement of T6 (eccentric to the left). Complete involvement of T7. Subtotal involvement of T8. Near complete involvement of T9 and T10. Subtotal involvement of T11 and T12. Cord: There is mild ventral epidural tumor extension at the T10 level primarily on the left (series 18, image 10). The ventral cord is a faced there. But no thoracic cord compression or cord signal abnormality. No abnormal intradural  enhancement or dural thickening. The conus medullaris is at T12-L1. Paraspinal and other soft tissues: No other thoracic extraosseous tumor extension identified. Disc levels: Intermittent thoracic degeneration, disc disease is most pronounced at T6-T7 (series 25, image 12) where the ventral cord is effaced but there is no significant spinal stenosis. MRI LUMBAR SPINE FINDINGS Segmentation:  Normal. Alignment: Preserved lumbar lordosis. There is subtle anterolisthesis of L4 on L5 which appears degenerative. Vertebrae: Widespread osseous metastatic disease. Widespread involvement of L1 and L2 eccentric to the right. Subtotal involvement of the L3 vertebral body. Widespread involvement of L4. Subtotal involvement of the L5 vertebra, with posterior element involvement on the right. Similar Patchy and confluent bilateral sacral metastatic disease. Conus medullaris: Extends to the T12-L1. Level No lower spinal cord or conus signal abnormality. No abnormal intradural enhancement or dural thickening. Paraspinal and other soft tissues: Negative visible abdominal viscera. Diverticulosis of the distal large bowel. Disc levels: Superimposed lumbar spine degeneration maximal at L4-L5 where mild anterolisthesis is associated with moderate disc and posterior element degeneration resulting in moderate to severe degenerative spinal stenosis. IMPRESSION: 1. 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 Multiple vertebrae completely replaced by tumor. No pathologic fracture identified. 2. 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 Subsequent malignant cervical spinal stenosis, including at the cervicomedullary junction. Mild cord compression, maximal at C2, but no spinal cord edema. Tumor infiltrates the bilateral C3 and C4 neural foramina. 3. Mild ventral epidural tumor at T10 eccentric to the left. No thoracic cord compression or cord signal abnormality. 4. Superimposed  degenerative disease. 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 Electronically Signed   By: Genevie Ann M.D.   On: 08/23/2021 11:32   MR Lumbar Spine W Wo Contrast  Result Date:  08/23/2021 CLINICAL DATA:  80 year old male with prostate cancer. Constant neck and spine pain. Abnormal cervical spine MRI last month. EXAM: MRI TOTAL SPINE WITHOUT AND WITH CONTRAST TECHNIQUE: Multisequence MR imaging of the spine from the cervical spine to the sacrum was performed prior to and following IV contrast administration for evaluation of spinal metastatic disease. CONTRAST:  6 mL Gadavist COMPARISON:  Cervical spine MRI 08/12/2021. FINDINGS: MRI CERVICAL SPINE FINDINGS Alignment: Stable lordosis. Vertebrae: T1 hypointense metastatic disease throughout the C1, C2, C3 vertebrae. Vertebral bodies and posterior elements affected at those levels. Heterogeneous, subtotal involvement of C4 as demonstrated on the MRI last month. Comparatively mild C5 involvement. Subtotal C6 vertebral involvement may have progressed since last month. C7 relatively spared. 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 Bulky epidural tumor and spinal stenosis continue to the inferior C4 level. And there is associated mild stenosis and mass effect also at the cervicomedullary junction. But despite the mass effect there is lower brainstem or cervical cord edema identified. Tumor infiltrates the bilateral C3 and C4 neural foramina, surrounds the vertebral arteries at those levels. The prevertebral space is relatively spared. No abnormal intradural enhancement. Posterior Fossa, vertebral arteries, paraspinal tissues: No signal abnormality at the cervicomedullary junction. Negative visible posterior fossa otherwise. Disc levels: Stable degenerative disease. Mild degenerative  spinal stenosis at C5-C6 and C6-C7. MRI THORACIC SPINE FINDINGS Alignment:  Preserved thoracic kyphosis. Vertebrae: Widespread osseous metastatic disease. Complete involvement of the T1 and T2 vertebrae. Moderate involvement of T3 (eccentric to the left). Patchy involvement of T4. Subtotal involvement of T5. Patchy involvement of T6 (eccentric to the left). Complete involvement of T7. Subtotal involvement of T8. Near complete involvement of T9 and T10. Subtotal involvement of T11 and T12. Cord: There is mild ventral epidural tumor extension at the T10 level primarily on the left (series 18, image 10). The ventral cord is a faced there. But no thoracic cord compression or cord signal abnormality. No abnormal intradural enhancement or dural thickening. The conus medullaris is at T12-L1. Paraspinal and other soft tissues: No other thoracic extraosseous tumor extension identified. Disc levels: Intermittent thoracic degeneration, disc disease is most pronounced at T6-T7 (series 25, image 12) where the ventral cord is effaced but there is no significant spinal stenosis. MRI LUMBAR SPINE FINDINGS Segmentation:  Normal. Alignment: Preserved lumbar lordosis. There is subtle anterolisthesis of L4 on L5 which appears degenerative. Vertebrae: Widespread osseous metastatic disease. Widespread involvement of L1 and L2 eccentric to the right. Subtotal involvement of the L3 vertebral body. Widespread involvement of L4. Subtotal involvement of the L5 vertebra, with posterior element involvement on the right. Similar Patchy and confluent bilateral sacral metastatic disease. Conus medullaris: Extends to the T12-L1. Level No lower spinal cord or conus signal abnormality. No abnormal intradural enhancement or dural thickening. Paraspinal and other soft tissues: Negative visible abdominal viscera. Diverticulosis of the distal large bowel. Disc levels: Superimposed lumbar spine degeneration maximal at L4-L5 where mild anterolisthesis is  associated with moderate disc and posterior element degeneration resulting in moderate to severe degenerative spinal stenosis. IMPRESSION: 1. 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 Multiple vertebrae completely replaced by tumor. No pathologic fracture identified. 2. 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 Subsequent malignant cervical spinal stenosis, including at the cervicomedullary junction. Mild cord compression, maximal at C2, but no spinal cord edema. Tumor infiltrates the  bilateral C3 and C4 neural foramina. 3. Mild ventral epidural tumor at T10 eccentric to the left. No thoracic cord compression or cord signal abnormality. 4. Superimposed degenerative disease. 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 Electronically Signed   By: Genevie Ann M.D.   On: 08/23/2021 11:32   CT CHEST ABDOMEN PELVIS W CONTRAST  Result Date: 08/28/2021 CLINICAL DATA:  Osseous metastatic disease, history of prostate cancer, weight loss EXAM: CT CHEST, ABDOMEN, AND PELVIS WITH CONTRAST TECHNIQUE: Multidetector CT imaging of the chest, abdomen and pelvis was performed following the standard protocol during bolus administration of intravenous contrast. CONTRAST:  163mL OMNIPAQUE IOHEXOL 300 MG/ML SOLN, additional oral enteric contrast COMPARISON:  MR total spine, 08/23/2021 CT abdomen pelvis, 06/13/2012 FINDINGS: CT CHEST FINDINGS Cardiovascular: Aortic atherosclerosis. Normal heart size. Three-vessel coronary artery calcifications. No pericardial effusion. Mediastinum/Nodes: No enlarged mediastinal, hilar, or axillary lymph nodes. Thyroid gland, trachea, and esophagus demonstrate no significant findings. Lungs/Pleura: Mild dependent bibasilar scarring and or partial atelectasis. No pleural effusion or pneumothorax. Musculoskeletal: No chest wall mass. CT ABDOMEN PELVIS FINDINGS Hepatobiliary: No solid liver  abnormality is seen. No gallstones, gallbladder wall thickening, or biliary dilatation. Pancreas: Unremarkable. No pancreatic ductal dilatation or surrounding inflammatory changes. Spleen: Normal in size without significant abnormality. Adrenals/Urinary Tract: Adrenal glands are unremarkable. Kidneys are normal, without renal calculi, solid lesion, or hydronephrosis. Bladder is unremarkable. Stomach/Bowel: Stomach is within normal limits. Appendix appears normal. No evidence of bowel wall thickening, distention, or inflammatory changes. Sigmoid diverticulosis. Vascular/Lymphatic: Aortic atherosclerosis. No enlarged abdominal or pelvic lymph nodes. Reproductive: Prostatomegaly. Other: No abdominal wall hernia or abnormality. No abdominopelvic ascites. 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 No overtly lytic lesions identified. Left eccentric epidural soft tissue at T10 is only very subtly appreciated by CT (series 2, image 45). IMPRESSION: 1. 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 No overtly lytic lesions identified. 2. Left eccentric epidural soft tissue at T10 is only very subtly appreciated by CT. 3. No evidence of primary malignancy, lymphadenopathy or other soft tissue metastatic disease in the chest, abdomen, or pelvis. 4. Prostatomegaly. 5. Coronary artery disease. Aortic Atherosclerosis (ICD10-I70.0). Electronically Signed   By: Delanna Ahmadi M.D.   On: 08/28/2021 06:48   US Venous Img Lower Unilateral Left  Result Date: 09/02/2021 CLINICAL DATA:  Left leg pain and swelling for several days EXAM: LEFT LOWER EXTREMITY VENOUS DOPPLER ULTRASOUND TECHNIQUE:  Gray-scale sonography with graded compression, as well as color Doppler and duplex ultrasound were performed to evaluate the lower extremity deep venous systems from the level of the common femoral vein and including the common femoral, femoral, profunda femoral, popliteal and calf veins including the posterior tibial, peroneal and gastrocnemius veins when visible. The superficial great saphenous vein was also interrogated. Spectral Doppler was utilized to evaluate flow at rest and with distal augmentation maneuvers in the common femoral, femoral and popliteal veins. COMPARISON:  None. FINDINGS: Contralateral Common Femoral Vein: Respiratory phasicity is normal and symmetric with the symptomatic side. No evidence of thrombus. Normal compressibility. Common Femoral Vein: No evidence of thrombus. Normal compressibility, respiratory phasicity and response to augmentation. Saphenofemoral Junction: No evidence of thrombus. Normal compressibility and flow on color Doppler imaging. Profunda Femoral Vein:  No evidence of thrombus. Normal compressibility and flow on color Doppler imaging. Femoral Vein: No evidence of thrombus. Normal compressibility, respiratory phasicity and response to augmentation. Popliteal Vein: No evidence of thrombus. Normal compressibility, respiratory phasicity and response to augmentation. Calf Veins: No evidence of thrombus. Normal compressibility and flow on color Doppler imaging. Superficial Great Saphenous Vein: No evidence of thrombus. Normal compressibility. Venous Reflux:  None. Other Findings: Complex fluid collection is noted in the left popliteal fossa measuring 2.3 x 1.6 cm consistent with a popliteal cyst. IMPRESSION: No evidence of deep venous thrombosis. Small popliteal cyst. Electronically Signed   By: Inez Catalina M.D.   On: 09/02/2021 02:31   DG Chest Portable 1 View  Result Date: 10/03/2021 CLINICAL DATA:  Shortness of breath EXAM: PORTABLE CHEST 1 VIEW COMPARISON:  Chest  x-ray 09/01/2021, CT chest 09/02/2021 FINDINGS: Enlarged cardiac and aortic arch silhouette due to patient rotation AP portable technique. Otherwise the heart and mediastinal contours are within normal limits. Aortic calcification. Left base streaky airspace opacity likely representing atelectasis. No focal consolidation. Interval development of increased interstitial markings. No pleural effusion. No pneumothorax. No acute osseous abnormality. IMPRESSION: Pulmonary edema. Superimposed infection/inflammation not excluded. Limited evaluation due to patient rotation. Recommend repeat frontal PA and lateral view of the chest. Electronically Signed   By: Iven Finn M.D.   On: 10-03-2021 20:05   Results for orders placed or performed during the hospital encounter of 09/02/21  Resp Panel by RT-PCR (Flu A&B, Covid) Nasopharyngeal Swab     Status: None   Collection Time: 09/02/21 12:37 AM   Specimen: Nasopharyngeal Swab; Nasopharyngeal(NP) swabs in vial transport medium  Result Value Ref Range Status   SARS Coronavirus 2 by RT PCR NEGATIVE NEGATIVE Final    Comment: (NOTE) SARS-CoV-2 target nucleic acids are NOT DETECTED.  The SARS-CoV-2 RNA is generally detectable in upper respiratory specimens during the acute phase of infection. The lowest concentration of SARS-CoV-2 viral copies this assay can detect is 138 copies/mL. A negative result does not preclude SARS-Cov-2 infection and should not be used as the sole basis for treatment or other patient management decisions. A negative result may occur with  improper specimen collection/handling, submission of specimen other than nasopharyngeal swab, presence of viral mutation(s) within the areas targeted by this assay, and inadequate number of viral copies(<138 copies/mL). A negative result must be combined with clinical observations, patient history, and epidemiological information. The expected result is Negative.  Fact Sheet for Patients:   EntrepreneurPulse.com.au  Fact Sheet for Healthcare Providers:  IncredibleEmployment.be  This test is no t yet approved or cleared by the [place] FDA and  has been authorized for detection and/or diagnosis of SARS-CoV-2 by FDA under an Emergency Use Authorization (EUA). This EUA will remain  in effect (meaning this test can be used) for the duration of the COVID-19 declaration under Section 564(b)(1) of the Act, 21 U.S.C.section 360bbb-3(b)(1), unless the authorization is terminated  or revoked sooner.       Influenza A by PCR NEGATIVE NEGATIVE Final   Influenza B by PCR NEGATIVE NEGATIVE Final    Comment: (NOTE) The Xpert Xpress SARS-CoV-2/FLU/RSV plus assay is intended as an aid in the diagnosis of influenza from Nasopharyngeal swab specimens and should not be used as a sole basis for treatment. Nasal washings and aspirates are unacceptable for Xpert Xpress SARS-CoV-2/FLU/RSV testing.  Fact Sheet for Patients: EntrepreneurPulse.com.au  Fact Sheet for Healthcare Providers: IncredibleEmployment.be  This test is not yet approved or cleared by  the [employer] and has been authorized for detection and/or diagnosis of SARS-CoV-2 by FDA under an Emergency Use Authorization (EUA). This EUA will remain in effect (meaning this test can be used) for the duration of the COVID-19 declaration under Section 564(b)(1) of the Act, 21 U.S.C. section 360bbb-3(b)(1), unless the authorization is terminated or revoked.  Performed at [facility], [address] [place], [place] [address]     Signed:  Berle Mull MD.  [facility] 09/03/2021 , 8:17 AM

## 2021-09-16 NOTE — Progress Notes (Addendum)
  Chaplain On-Call responded to call from [employer], who reported patient in cardiac distress and the presence of family.  Chaplain arrived on the Unit and received report from [place] that the patient died, and that his sons have questions about final arrangements.  Chaplain met patient's sons Stuart Mcneil and Stuart Mcneil at bedside, and offered time for life review and special memories of their father. Stuart Mcneil and Stuart Mcneil stated their anxiety about informing their step-mother, who is a resident at [facility] and does not know about the patient's sudden illness and death.  Chaplain provided a calming presence and offered suggestions about telling the news to their mother. Chaplain gave spiritual and emotional support and prayer.  Chaplain Pollyann Samples M.Div., [facility]

## 2021-09-16 NOTE — ED Notes (Signed)
-   Family at bedside @ 1941 - Blood started @ 1941 - pt breathing Q 4sec 2005 -MD at bedside @ 2005 -RN non pulse @ 2013 - MD at bedside @ 2013 - MD TOB @ 2015 - Chaplin called @ 2020

## 2021-09-16 NOTE — ED Provider Notes (Signed)
[facility] Emergency Department Provider Note   ____________________________________________   Event Date/Time   First MD Initiated Contact with Patient September 18, 2021 1912     (approximate)  I have reviewed the triage vital signs and the nursing notes.   HISTORY  Chief Complaint Shortness of Breath (Pt in from via EMS with SOB, N/V/D blood for 2hrs. EMS placed 20g RT wrist with 555ml n/s. Pt in bed AxOx4, with labored breathing.)    HPI Stuart Mcneil is a 80 y.o. male with past medical history of hyperlipidemia, rheumatoid arthritis, and iron deficiency anemia who presents to the ED complaining of hematemesis.  Patient was recently admitted to the hospital for worsening anemia of unknown etiology, underwent EGD at that time and found to have nonbleeding esophageal mass.  He was previously noted to have bony metastatic disease with this esophageal mass likely being the primary.  He received transfusion and was discharged from the hospital yesterday, doing well at that time.  About 1 hour prior to arrival, patient suddenly started vomiting.  Per son, initial emesis looks like the broth that he had for dinner but afterwards he had multiple episodes of hematemesis.  He also began having blood in his stool.  EMS was called, reports patient appeared pale at the time of their arrival, noted to be tachycardic and increasingly short of breath.  Patient does not take any blood thinners.  He reports feeling increasingly short of breath on arrival to the ED, continues to feel nauseous like he is going to vomit again.        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 Authorizing Provider  diazepam (VALIUM) 2 MG tablet Take 2 mg by mouth every 12 (twelve) hours as needed for pain. 06/18/21   [provider]  etanercept (ENBREL) 50 MG/ML injection 50 mg once a week. 05/22/14   [provider]  folic acid (FOLVITE) 1 MG tablet Take 1 mg by mouth daily. 05/22/14   [provider]  HYDROcodone-acetaminophen (NORCO/VICODIN) 5-325 MG tablet Take 1 tablet by mouth every 8 (eight) hours as needed for moderate pain or severe pain. 09/03/21   Lavina Hamman, MD  mirabegron ER (MYRBETRIQ) 50 MG TB24 tablet Take 1 tablet (50 mg total) by mouth daily. 04/07/21   Zara Council A, PA-C  Multiple Vitamins-Minerals (MULTIVITAMIN ADULT PO) Take by mouth daily.    [provider]  Nutritional Supplements (ENSURE  NUTRITION SHAKE) LIQD Take 1 each by mouth 3 (three) times daily before meals. 09/03/21   Lavina Hamman, MD  omeprazole (PRILOSEC) 20 MG capsule Take 20 mg by mouth daily. 08/06/21   [provider]  sildenafil (VIAGRA) 100 MG tablet Take 1 tablet (100 mg total) by mouth daily as needed for erectile dysfunction. 03/17/21   Zara Council A, PA-C  tadalafil (CIALIS) 5 MG tablet Take one tablet daily for benign prostatic hyperplasia 03/17/21   Zara Council  A, PA-C  tiZANidine (ZANAFLEX) 4 MG tablet Take 2 mg by mouth 3 (three) times daily as needed for muscle spasms. 08/10/21   [provider]    Allergies Patient has no known allergies.  Family History  Problem Relation Age of Onset   Diabetes Mellitus II Father    Diabetes Mellitus II Sister    Kidney disease Neg Hx    Prostate cancer Neg Hx    Bladder Cancer Neg Hx     Social History Social History   Tobacco Use   Smoking status: Former   Smokeless tobacco: Never   Tobacco comments:    quit 30 years  Substance Use Topics   Alcohol use: No    Alcohol/week: 0.0 standard drinks   Drug use: No    Review of Systems  Constitutional: No fever/chills Eyes: No visual changes. ENT: No sore throat. Cardiovascular: Denies chest pain. Respiratory: Positive for shortness of breath. Gastrointestinal: No abdominal pain.  Positive for hematemesis and bloody stools.  No constipation. Genitourinary: Negative for dysuria. Musculoskeletal: Negative for back pain. Skin: Negative for rash. Neurological: Negative for headaches, focal weakness or numbness.  ____________________________________________   PHYSICAL EXAM:  VITAL SIGNS: ED Triage Vitals  Enc Vitals Group     BP      Pulse      Resp      Temp      Temp src      SpO2      Weight      Height      Head Circumference      Peak Flow      Pain Score      Pain Loc      Pain Edu?      Excl. in [place]?     Constitutional: Alert and oriented.  Pale and diaphoretic. Eyes: Conjunctivae are normal. Head: Atraumatic. Nose: No congestion/rhinnorhea. Mouth/Throat: Mucous membranes are moist. Neck: Normal ROM Cardiovascular: Tachycardic, regular rhythm. Grossly normal heart sounds.  1+ radial pulses bilaterally. Respiratory: Tachypneic with increased respiratory effort.  No retractions. Lungs CTAB. Gastrointestinal: Soft and nontender. No distention. Genitourinary: deferred Musculoskeletal: No lower extremity  tenderness nor edema. Neurologic:  Normal speech and language. No gross focal neurologic deficits are appreciated. Skin:  Skin is warm, dry and intact. No rash noted. Psychiatric: Mood and affect are normal. Speech and behavior are normal.  ____________________________________________   LABS (all labs ordered are listed, but only abnormal results are displayed)  Labs Reviewed  RESP PANEL BY RT-PCR (FLU A&B, COVID) [id number]  CBC WITH DIFFERENTIAL/PLATELET  COMPREHENSIVE METABOLIC PANEL  LIPASE, BLOOD  PROTIME-INR  PREPARE RBC (CROSSMATCH)  TYPE AND SCREEN  TROPONIN I (HIGH SENSITIVITY)  TROPONIN I (HIGH SENSITIVITY)   ____________________________________________  EKG  ED ECG REPORT I, Blake Divine, the attending physician, personally viewed and interpreted this ECG.   Date: September 28, 2021  EKG Time: 19:28  Rate: 122  Rhythm: sinus tachycardia  Axis: Normal  Intervals:none  ST&T Change: Lateral T wave inversions  PROCEDURES  Procedure(s) performed (including Critical Care):  .Critical Care Performed by: Blake Divine, MD Authorized by: Blake Divine, MD   Critical care provider statement:    Critical care time (minutes):  45   Critical care time was exclusive of:  Separately billable procedures and treating other patients and teaching time   Critical care was necessary to treat or prevent imminent or life-threatening deterioration of the following conditions:  Shock   Critical care was time spent personally by me on the following activities:  Development of treatment plan with patient or surrogate, discussions with consultants, evaluation of patient's response to treatment, examination of patient, ordering and review of laboratory studies, ordering and review of radiographic studies, ordering and performing treatments and interventions, pulse oximetry, re-evaluation of patient's condition and review of old charts   I assumed direction of critical care for this patient  from another provider in my specialty: no     ____________________________________________   INITIAL IMPRESSION / ASSESSMENT AND PLAN / ED COURSE      80 year old male with past medical history of hyperlipidemia, rheumatoid arthritis, iron deficiency anemia, and recent diagnosis of metastatic esophageal cancer who presents to the ED with acute onset hematemesis and bloody stools about 1 hour prior to arrival.  Patient is in significant distress upon arrival, tachycardic and tachypneic but with stable blood pressure.  IV fluid bolus was started and decision was made to proceed with emergency blood administration given concern for severe GI bleeding.  Patient continued to have hematemesis here in the ED but remained alert and oriented.  He stated that he did not want to receive chest compressions or be intubated, son arrived at bedside and confirmed this, stated patient had previously signed a DNR.  I spoke with Dr. Allen Norris from GI, who stated there were no good options for endoscopic intervention as any attempt to stop bleeding from the mass would only likely worsen bleeding.  This was explained to patient's son, who agreed with plan to proceed with transfusion and to see if we could medically stabilize patient.  Unfortunately, his condition continued to worsen as he became increasingly short of breath and agitated with confusion.  He was given 2 mg of morphine for comfort and in further discussion with family decision was made to transition to comfort care.  Patient expired shortly thereafter.      ____________________________________________   FINAL CLINICAL IMPRESSION(S) / ED DIAGNOSES  Final diagnoses:  Esophageal mass  Upper GI bleed     ED Discharge Orders     None        Note:  This document was prepared using Dragon voice recognition software and may include unintentional dictation errors.    Blake Divine, MD 2021-10-01 [id number]

## 2021-09-16 NOTE — ED Notes (Signed)
Family left. (Sons)

## 2021-09-16 NOTE — ED Notes (Signed)
[employer] Release form in chart

## 2021-09-16 NOTE — ED Notes (Signed)
Pt placed in body bag and house sup called

## 2021-09-16 DEATH — deceased

## 2022-04-07 ENCOUNTER — Ambulatory Visit: Payer: Self-pay | Admitting: Urology
